# Patient Record
Sex: Male | Born: 1980 | Race: Black or African American | Hispanic: No | Marital: Single | Smoking: Never smoker
Health system: Southern US, Community
[De-identification: ages and names within clinical notes are randomized; demographics above are authoritative.]

## PROBLEM LIST (undated history)

## (undated) DIAGNOSIS — E119 Type 2 diabetes mellitus without complications: Secondary | ICD-10-CM

## (undated) DIAGNOSIS — I639 Cerebral infarction, unspecified: Secondary | ICD-10-CM

## (undated) HISTORY — PX: CATARACT EXTRACTION: SUR2

## (undated) HISTORY — PX: CARDIAC SURGERY: SHX584

---

## 2013-03-10 ENCOUNTER — Emergency Department (HOSPITAL_COMMUNITY)
Admission: EM | Admit: 2013-03-10 | Discharge: 2013-03-11 | Disposition: A | Discharge status: Home / Self Care | Payer: Self-pay | Attending: Emergency Medicine | Admitting: Emergency Medicine

## 2013-03-10 ENCOUNTER — Encounter (HOSPITAL_COMMUNITY): Payer: Self-pay | Admitting: Emergency Medicine

## 2013-03-10 DIAGNOSIS — R7309 Other abnormal glucose: Secondary | ICD-10-CM | POA: Insufficient documentation

## 2013-03-10 DIAGNOSIS — E119 Type 2 diabetes mellitus without complications: Secondary | ICD-10-CM | POA: Insufficient documentation

## 2013-03-10 DIAGNOSIS — Z9119 Patient's noncompliance with other medical treatment and regimen: Secondary | ICD-10-CM | POA: Insufficient documentation

## 2013-03-10 DIAGNOSIS — R739 Hyperglycemia, unspecified: Secondary | ICD-10-CM

## 2013-03-10 DIAGNOSIS — Z9114 Patient's other noncompliance with medication regimen: Secondary | ICD-10-CM

## 2013-03-10 DIAGNOSIS — R3589 Other polyuria: Secondary | ICD-10-CM | POA: Insufficient documentation

## 2013-03-10 DIAGNOSIS — Z794 Long term (current) use of insulin: Secondary | ICD-10-CM | POA: Insufficient documentation

## 2013-03-10 DIAGNOSIS — R631 Polydipsia: Secondary | ICD-10-CM | POA: Insufficient documentation

## 2013-03-10 DIAGNOSIS — R358 Other polyuria: Secondary | ICD-10-CM | POA: Insufficient documentation

## 2013-03-10 DIAGNOSIS — Z91199 Patient's noncompliance with other medical treatment and regimen due to unspecified reason: Secondary | ICD-10-CM | POA: Insufficient documentation

## 2013-03-10 DIAGNOSIS — Z79899 Other long term (current) drug therapy: Secondary | ICD-10-CM | POA: Insufficient documentation

## 2013-03-10 HISTORY — DX: Type 2 diabetes mellitus without complications: E11.9

## 2013-03-10 LAB — URINALYSIS, ROUTINE W REFLEX MICROSCOPIC
Glucose, UA: >1000 mg/dL — AB
Hgb urine dipstick: NEGATIVE
Ketones, ur: 15 mg/dL — AB
Leukocytes, UA: NEGATIVE
Protein, ur: NEGATIVE mg/dL
Urobilinogen, UA: 0.2 mg/dL (ref 0.0–1.0)

## 2013-03-10 LAB — POCT I-STAT, CHEM 8
Chloride: 91 mEq/L — ABNORMAL LOW (ref 96–112)
Glucose, Bld: >700 mg/dL (ref 70–99)
HCT: 55 % — ABNORMAL HIGH (ref 39.0–52.0)
Potassium: 4 mEq/L (ref 3.5–5.1)
Sodium: 125 mEq/L — ABNORMAL LOW (ref 135–145)

## 2013-03-10 LAB — URINE MICROSCOPIC-ADD ON

## 2013-03-10 LAB — CBC WITH DIFFERENTIAL/PLATELET
Basophils Relative: 1 % (ref 0–1)
HCT: 46.2 % (ref 39.0–52.0)
Hemoglobin: 16.4 g/dL (ref 13.0–17.0)
Lymphocytes Relative: 30 % (ref 12–46)
Lymphs Abs: 2.1 10*3/uL (ref 0.7–4.0)
MCHC: 35.5 g/dL (ref 30.0–36.0)
Monocytes Absolute: 0.8 10*3/uL (ref 0.1–1.0)
Monocytes Relative: 12 % (ref 3–12)
Neutro Abs: 4 10*3/uL (ref 1.7–7.7)
Neutrophils Relative %: 56 % (ref 43–77)
RBC: 5.8 MIL/uL (ref 4.22–5.81)
WBC: 7.1 10*3/uL (ref 4.0–10.5)

## 2013-03-10 LAB — COMPREHENSIVE METABOLIC PANEL
Albumin: 3.7 g/dL (ref 3.5–5.2)
Alkaline Phosphatase: 184 U/L — ABNORMAL HIGH (ref 39–117)
BUN: 9 mg/dL (ref 6–23)
CO2: 22 mEq/L (ref 19–32)
Chloride: 84 mEq/L — ABNORMAL LOW (ref 96–112)
Creatinine, Ser: 1.01 mg/dL (ref 0.50–1.35)
GFR calc non Af Amer: >90 mL/min (ref >90)
Potassium: 4 mEq/L (ref 3.5–5.1)
Total Bilirubin: 0.8 mg/dL (ref 0.3–1.2)

## 2013-03-10 LAB — GLUCOSE, CAPILLARY

## 2013-03-10 MED ORDER — SODIUM CHLORIDE 0.9 % IV SOLN: 1000.0000 mL | INTRAVENOUS | Status: DC

## 2013-03-10 MED ORDER — SODIUM CHLORIDE 0.9 % IV SOLN
1000.0000 mL | Freq: Once | INTRAVENOUS | Status: AC
  Administered 2013-03-11: 1000 mL via INTRAVENOUS

## 2013-03-10 NOTE — ED Notes (Signed)
PT. REPORTS ELEVATED BLOOD SUGAR THIS EVENING WITH POLYDIPSIA / POLYURIA , PT. DID NOT TAKE HIS INSULIN THIS EVENING . RESPIRATIONS UNLABORED.

## 2013-03-10 NOTE — ED Notes (Signed)
CBG Exceeds Measure. Notified Nurse Reita Cliche.

## 2013-03-11 LAB — GLUCOSE, CAPILLARY: Glucose-Capillary: 547 mg/dL — ABNORMAL HIGH (ref 70–99)

## 2013-03-11 MED ORDER — INSULIN ASPART 100 UNIT/ML ~~LOC~~ SOLN
8.0000 [IU] | Freq: Once | SUBCUTANEOUS | Status: AC
  Administered 2013-03-11: 8 [IU] via INTRAVENOUS
  Filled 2013-03-11: qty 1

## 2013-03-11 MED ORDER — INSULIN ASPART PROT & ASPART (70-30 MIX) 100 UNIT/ML ~~LOC~~ SUSP
30.0000 [IU] | Freq: Once | SUBCUTANEOUS | Status: AC
  Administered 2013-03-11: 30 [IU] via SUBCUTANEOUS
  Filled 2013-03-11: qty 10

## 2013-03-11 NOTE — ED Provider Notes (Signed)
CSN: [id number]     Arrival date & time 03/10/13  2240 History   First MD Initiated Contact with Patient 03/10/13 2347     Chief Complaint  Patient presents with  . Hyperglycemia    The history is provided by the patient.   patient reports hyperglycemia today.  The patient has a known history of diabetes.  He is on insulin 70/30, 30 units in the morning and in the evening.  He states his insulin has been in a car and his car has not been in this position of the past 36 hours.  He has not refilled his insulin at the pharmacy.  He denies nausea and vomiting.  He reports polyuria and polydipsia he reports his blood sugar was too high to check at home.  Patient without other complaints.  No dysuria.  No abdominal pain.  No headache.  No rash  Past Medical History  Diagnosis Date  . Diabetes mellitus without complication    Past Surgical History  Procedure Laterality Date  . Cardiac surgery     No family history on file. History  Substance Use Topics  . Smoking status: Never Smoker   . Smokeless tobacco: Not on file  . Alcohol Use: No    Review of Systems  All other systems reviewed and are negative.    Allergies  Pineapple  Home Medications   Current Outpatient Rx  Name  Route  Sig  Dispense  Refill  . insulin aspart protamine- aspart (NOVOLOG MIX 70/30) (70-30) 100 UNIT/ML injection   Subcutaneous   Inject 30 Units into the skin 2 (two) times daily with a meal.         . metFORMIN (GLUCOPHAGE) 500 MG tablet   Oral   Take 500 mg by mouth daily with breakfast.          BP 131/92  Pulse 92  Temp(Src) 98.1 F (36.7 C) (Oral)  Resp 18  SpO2 97% Physical Exam  Nursing note and vitals reviewed. Constitutional: He is oriented to person, place, and time. He appears well-developed and well-nourished.  HENT:  Head: Normocephalic and atraumatic.  Eyes: EOM are normal.  Neck: Normal range of motion.  Cardiovascular: Normal rate, regular rhythm, normal heart sounds and  intact distal pulses.   Pulmonary/Chest: Effort normal and breath sounds normal. No respiratory distress.  Abdominal: Soft. He exhibits no distension. There is no tenderness.  Genitourinary: Rectum normal.  Musculoskeletal: Normal range of motion.  Neurological: He is alert and oriented to person, place, and time.  Skin: Skin is warm and dry.  Psychiatric: He has a normal mood and affect. Judgment normal.    ED Course  Procedures (including critical care time) Labs Review Labs Reviewed  GLUCOSE, CAPILLARY - Abnormal; Notable for the following:    Glucose-Capillary >600 (*)    All other components within normal limits  COMPREHENSIVE METABOLIC PANEL - Abnormal; Notable for the following:    Sodium 123 (*)    Chloride 84 (*)    Glucose, Bld 876 (*)    Alkaline Phosphatase 184 (*)    All other components within normal limits  URINALYSIS, ROUTINE W REFLEX MICROSCOPIC - Abnormal; Notable for the following:    Specific Gravity, Urine 1.039 (*)    Glucose, UA >1000 (*)    Ketones, ur 15 (*)    All other components within normal limits  GLUCOSE, CAPILLARY - Abnormal; Notable for the following:    Glucose-Capillary 547 (*)    All other  components within normal limits  POCT I-STAT, CHEM 8 - Abnormal; Notable for the following:    Sodium 125 (*)    Chloride 91 (*)    Glucose, Bld >700 (*)    Hemoglobin 18.7 (*)    HCT 55.0 (*)    All other components within normal limits  CBC WITH DIFFERENTIAL  URINE MICROSCOPIC-ADD ON   Imaging Review No results found.  MDM   1. Hyperglycemia   2. Noncompliance with medication regimen    2:16 AM Patient feels much better after IV fluids and insulin.  His hyperglycemia secondary to noncompliance of his medications.  He has no signs of DKA.  His anion gap is normal.  Patient has had a significant decrease in his blood sugar in the emergency department from 876 tablet 547.  He was given his dose of nightly 70/30.  He states he will build refill  his prescription for 7030 tomorrow morning.    Lyanne Co, MD 03/11/13 [phone]

## 2013-03-11 NOTE — ED Notes (Signed)
CBG 547 

## 2013-03-14 ENCOUNTER — Emergency Department (HOSPITAL_COMMUNITY)
Admission: EM | Admit: 2013-03-14 | Discharge: 2013-03-14 | Disposition: A | Discharge status: Home / Self Care | Payer: Self-pay | Attending: Emergency Medicine | Admitting: Emergency Medicine

## 2013-03-14 ENCOUNTER — Encounter (HOSPITAL_COMMUNITY): Payer: Self-pay | Admitting: *Deleted

## 2013-03-14 DIAGNOSIS — Z794 Long term (current) use of insulin: Secondary | ICD-10-CM | POA: Insufficient documentation

## 2013-03-14 DIAGNOSIS — E119 Type 2 diabetes mellitus without complications: Secondary | ICD-10-CM | POA: Insufficient documentation

## 2013-03-14 DIAGNOSIS — R197 Diarrhea, unspecified: Secondary | ICD-10-CM | POA: Insufficient documentation

## 2013-03-14 DIAGNOSIS — R739 Hyperglycemia, unspecified: Secondary | ICD-10-CM

## 2013-03-14 LAB — CBC WITH DIFFERENTIAL/PLATELET
Basophils Absolute: 0.2 10*3/uL — ABNORMAL HIGH (ref 0.0–0.1)
HCT: 44.2 % (ref 39.0–52.0)
Lymphs Abs: 3.5 10*3/uL (ref 0.7–4.0)
MCH: 26.8 pg (ref 26.0–34.0)
MCHC: 33 g/dL (ref 30.0–36.0)
MCV: 81.1 fL (ref 78.0–100.0)
Monocytes Absolute: 1 10*3/uL (ref 0.1–1.0)
Neutro Abs: 4.6 10*3/uL (ref 1.7–7.7)
Platelets: 309 10*3/uL (ref 150–400)
RDW: 12.4 % (ref 11.5–15.5)

## 2013-03-14 LAB — BLOOD GAS, VENOUS
Acid-base deficit: 1.5 mmol/L (ref 0.0–2.0)
Bicarbonate: 20.2 mEq/L (ref 20.0–24.0)
O2 Saturation: 98.6 %
Patient temperature: 98.6
pO2, Ven: 116 mmHg — ABNORMAL HIGH (ref 30.0–45.0)

## 2013-03-14 LAB — GLUCOSE, CAPILLARY: Glucose-Capillary: 309 mg/dL — ABNORMAL HIGH (ref 70–99)

## 2013-03-14 LAB — BASIC METABOLIC PANEL
BUN: 8 mg/dL (ref 6–23)
Creatinine, Ser: 0.82 mg/dL (ref 0.50–1.35)
GFR calc Af Amer: >90 mL/min (ref >90)
GFR calc non Af Amer: >90 mL/min (ref >90)
Glucose, Bld: 461 mg/dL — ABNORMAL HIGH (ref 70–99)

## 2013-03-14 MED ORDER — INSULIN ASPART 100 UNIT/ML ~~LOC~~ SOLN: 2.0000 [IU] | Freq: Three times a day (TID) | SUBCUTANEOUS | Status: DC

## 2013-03-14 MED ORDER — INSULIN ASPART 100 UNIT/ML ~~LOC~~ SOLN
8.0000 [IU] | Freq: Once | SUBCUTANEOUS | Status: AC
  Administered 2013-03-14: 8 [IU] via SUBCUTANEOUS
  Filled 2013-03-14: qty 1

## 2013-03-14 MED ORDER — DIBUCAINE 1 % EX OINT: TOPICAL_OINTMENT | Freq: Three times a day (TID) | CUTANEOUS | Status: DC | PRN

## 2013-03-14 MED ORDER — LOPERAMIDE HCL 2 MG PO CAPS: 2.0000 mg | ORAL_CAPSULE | Freq: Four times a day (QID) | ORAL | Status: DC | PRN

## 2013-03-14 MED ORDER — SODIUM CHLORIDE 0.9 % IV BOLUS (SEPSIS)
1000.0000 mL | Freq: Once | INTRAVENOUS | Status: AC
  Administered 2013-03-14: 1000 mL via INTRAVENOUS

## 2013-03-14 NOTE — ED Provider Notes (Signed)
Medical screening examination/treatment/procedure(s) were conducted as a shared visit with non-physician practitioner(s) and myself.  I personally evaluated the patient during the encounter   Hyperglycemia has improved, stable for discharge. No signs of drainable abscess, and no fluctuance in rectal vault to suggest deep infection. Will recommend supportive care and f/u if worsens.  Audree Camel, MD 03/14/13 [phone]

## 2013-03-14 NOTE — ED Notes (Signed)
Pt took blood sugar yesterday around 2000 and blood sugar was around 600. Pt states he stayed home and drank about 2 gallons of h20. Pt states he retook cbg and was still elevated. Pt states he was seen [facility] and they sent him home with a blood sugar of 526.

## 2013-03-14 NOTE — ED Provider Notes (Signed)
7:12 AM BP 133/79  Pulse 87  Temp(Src) 98.3 F (36.8 C) (Oral)  Resp 18  Ht 5\' 11"  (1.803 m)  SpO2 95% Assumed care of the patient from NP [facility].  S-Patient is a newly diagnosed diabetic here increased her from [place] stating that he has had diarrhea and hyperglycemia.  Patient seen 03/10/2013 at [facility] emergency department for the same.  He had a non-anion gap hyperglycemia. Patient c/o rectal pain after defacation. Worse on R side  O- Obese male in NAD CV: RRR, No M/R/G, Peripheral pulses intact. No peripheral edema. Lungs: CTAB Abd: Soft, Non tender, non distended Digital Rectal Exam reveals sphincter with good tone. No external hemorrhoids. Induration at 9:00 with out palpable fluctuance or visible hemorrhoids, fissures. Stool color is brown with no overt blood.   A- Hyperglycemia without anion gap.   P- CBG<200 discharge. Add 2 untis Novolog TID before meals with cbg diary and pcp follow up this week.   9:18 AM Patient seen in shared visit with [facility] [place]. WIll d/c patient at this time with novolog, encourage warm soaks/ sits baths loperamide for diarrhea and dibucaine for rectal pain. Return if sxs of infection or abscess. Discussed verbally with  the patient.  Arthor Captain, PA-C 03/14/13 [phone]

## 2013-03-14 NOTE — ED Notes (Signed)
Pt given pitcher of water per [place], [place] to drink. Pt does not want to have another IV started d/t hard stick. Wants get fluids/meds PO

## 2013-03-14 NOTE — ED Provider Notes (Signed)
Medical screening examination/treatment/procedure(s) were performed by non-physician practitioner and as supervising physician I was immediately available for consultation/collaboration.  Olivia Mackie, MD 03/14/13 [phone]

## 2013-03-14 NOTE — ED Provider Notes (Signed)
CSN: [id number]     Arrival date & time 03/14/13  0226 History   First MD Initiated Contact with Patient 03/14/13 [phone]     Chief Complaint  Patient presents with  . Hyperglycemia   (Consider location/radiation/quality/duration/timing/severity/associated sxs/prior Treatment) HPI Comments: Patient states he's been newly diagnosed with diabetes within the last 2 months he is followed by his physician in [place] he was started on 7030 she states he's been taking he does report that the last 3 days he's had diarrhea while he is in [place] he was seen yesterday across his DD for blood sugar close to 600 states he was discharged with his blood sugar being made to high he states he did use a 7030 twice today but he's had diarrhea for the last 3 days. Which is not revealed in the patient's chart from yesterday  Patient is a 32 y.o. male presenting with hyperglycemia. The history is provided by the patient.  Hyperglycemia Blood sugar level PTA:  2000 Onset quality:  Unable to specify Duration:  3 days Timing:  Intermittent Progression:  Unchanged Diabetes status:  Controlled with insulin Current diabetic therapy:  70/30 Context: insulin pump use, new diabetes diagnosis and recent illness   Context: not change in medication   Relieved by:  None tried Ineffective treatments:  None tried Associated symptoms: no abdominal pain, no dizziness, no fever, no nausea, no shortness of breath and no vomiting   Associated symptoms comment:  Diarrhea   Past Medical History  Diagnosis Date  . Diabetes mellitus without complication    Past Surgical History  Procedure Laterality Date  . Cardiac surgery     History reviewed. No pertinent family history. History  Substance Use Topics  . Smoking status: Never Smoker   . Smokeless tobacco: Not on file  . Alcohol Use: No    Review of Systems  Constitutional: Negative for fever and chills.  Respiratory: Negative for shortness of breath.    Gastrointestinal: Positive for diarrhea. Negative for nausea, vomiting and abdominal pain.  Musculoskeletal: Negative for myalgias.  Skin: Negative for rash.  Neurological: Negative for dizziness and headaches.    Allergies  Pineapple  Home Medications   Current Outpatient Rx  Name  Route  Sig  Dispense  Refill  . insulin aspart protamine- aspart (NOVOLOG MIX 70/30) (70-30) 100 UNIT/ML injection   Subcutaneous   Inject 30 Units into the skin 2 (two) times daily with a meal.          BP 133/79  Pulse 87  Temp(Src) 98.3 F (36.8 C) (Oral)  Resp 18  Ht 5\' 11"  (1.803 m)  SpO2 95% Physical Exam  Nursing note and vitals reviewed. Constitutional: He is oriented to person, place, and time. He appears well-developed and well-nourished.  Obese  HENT:  Head: Normocephalic.  Eyes: Pupils are equal, round, and reactive to light.  Neck: Normal range of motion.  Cardiovascular: Normal rate and regular rhythm.   Pulmonary/Chest: Effort normal.  Abdominal: Soft.  Morbidly obese  Musculoskeletal: Normal range of motion. He exhibits no tenderness.  Lymphadenopathy:    He has no cervical adenopathy.  Neurological: He is alert and oriented to person, place, and time.  Skin: No pallor.    ED Course  Procedures (including critical care time) Labs Review Labs Reviewed  GLUCOSE, CAPILLARY - Abnormal; Notable for the following:    Glucose-Capillary 452 (*)    All other components within normal limits  CBC WITH DIFFERENTIAL  BASIC METABOLIC PANEL  BLOOD  GAS, VENOUS   Imaging Review No results found.  MDM  No diagnosis found. Patient is not appear ill or toxic his heart rate at 87 is not tachypneic with a respiratory rate of 18 I will add Humalog to his medical regime He is to test his BS prior to meals and keep a log  he is to use 2 units Humalog AC and continue 70/30 in AM and HS To follow up with PCP     Arman Filter, NP 03/14/13 [phone]

## 2013-03-16 ENCOUNTER — Encounter (HOSPITAL_COMMUNITY): Payer: Self-pay | Admitting: *Deleted

## 2013-03-16 ENCOUNTER — Emergency Department (HOSPITAL_COMMUNITY)
Admission: EM | Admit: 2013-03-16 | Discharge: 2013-03-16 | Disposition: A | Discharge status: Home / Self Care | Payer: Self-pay | Attending: Emergency Medicine | Admitting: Emergency Medicine

## 2013-03-16 DIAGNOSIS — K612 Anorectal abscess: Secondary | ICD-10-CM | POA: Insufficient documentation

## 2013-03-16 DIAGNOSIS — Z79899 Other long term (current) drug therapy: Secondary | ICD-10-CM | POA: Insufficient documentation

## 2013-03-16 DIAGNOSIS — E119 Type 2 diabetes mellitus without complications: Secondary | ICD-10-CM | POA: Insufficient documentation

## 2013-03-16 DIAGNOSIS — L0291 Cutaneous abscess, unspecified: Secondary | ICD-10-CM

## 2013-03-16 DIAGNOSIS — Z794 Long term (current) use of insulin: Secondary | ICD-10-CM | POA: Insufficient documentation

## 2013-03-16 LAB — GLUCOSE, CAPILLARY: Glucose-Capillary: 361 mg/dL — ABNORMAL HIGH (ref 70–99)

## 2013-03-16 MED ORDER — HYDROCODONE-ACETAMINOPHEN 5-325 MG PO TABS: 1.0000 | ORAL_TABLET | Freq: Four times a day (QID) | ORAL | Status: DC | PRN

## 2013-03-16 MED ORDER — SULFAMETHOXAZOLE-TRIMETHOPRIM 800-160 MG PO TABS: 2.0000 | ORAL_TABLET | Freq: Two times a day (BID) | ORAL | Status: DC

## 2013-03-16 NOTE — ED Provider Notes (Signed)
Medical screening examination/treatment/procedure(s) were conducted as a shared visit with non-physician practitioner(s) and myself.  I personally evaluated the patient during the encounter  Rectal pain with tender fluctuant palpable abscess on exam. Moderate amount of. Drainage with I&D  Diagnosis abscess  Plan discharge with antibiotics and referral to general surgery  Sunnie Nielsen, MD 03/16/13 0700

## 2013-03-16 NOTE — ED Notes (Signed)
Evaluated "the other day for an abscess on my bottom, he told me if it got bigger to come back" Reports abscess on rt buttocks

## 2013-03-16 NOTE — ED Provider Notes (Signed)
CSN: [id number]     Arrival date & time 03/16/13  0227 History   First MD Initiated Contact with Patient 03/16/13 0303     Chief Complaint  Patient presents with  . Abscess    buttocks   (Consider location/radiation/quality/duration/timing/severity/associated sxs/prior Treatment) HPI Comments: Patient presents with a rectal pain.  He reports that he has noticed pain and swelling of the right side of his rectum for the past couple of days, which is gradually worsening.  He was seen in the ED for the same yesterday.  At that time abscess was not thought to be drainable.  He reports that he does have pain with bowel movements.  He denies fever or chills.  Denies nausea or vomiting.  He denies abdominal pain.  He reports that he has never had an abscess before.  He is diabetic.    Patient is a 32 y.o. male presenting with abscess. The history is provided by the patient.  Abscess Associated symptoms: no fever     Past Medical History  Diagnosis Date  . Diabetes mellitus without complication    Past Surgical History  Procedure Laterality Date  . Cardiac surgery     No family history on file. History  Substance Use Topics  . Smoking status: Never Smoker   . Smokeless tobacco: Not on file  . Alcohol Use: No    Review of Systems  Constitutional: Negative for fever and chills.  All other systems reviewed and are negative.    Allergies  Pineapple  Home Medications   Current Outpatient Rx  Name  Route  Sig  Dispense  Refill  . dibucaine (NUPERCAINAL) 1 % ointment   Topical   Apply topically 3 (three) times daily as needed for pain.   30 g   0   . insulin aspart (NOVOLOG) 100 UNIT/ML injection   Subcutaneous   Inject 2 Units into the skin 3 (three) times daily before meals.   1 vial   12   . insulin aspart protamine- aspart (NOVOLOG MIX 70/30) (70-30) 100 UNIT/ML injection   Subcutaneous   Inject 30 Units into the skin 2 (two) times daily with a meal.         .  loperamide (IMODIUM) 2 MG capsule   Oral   Take 1 capsule (2 mg total) by mouth 4 (four) times daily as needed for diarrhea or loose stools.   30 capsule   0    BP 128/83  Pulse 101  Temp(Src) 99.1 F (37.3 C) (Oral)  Resp 16  SpO2 98% Physical Exam  Nursing note and vitals reviewed. Constitutional: He appears well-developed and well-nourished.  HENT:  Head: Normocephalic and atraumatic.  Mouth/Throat: Oropharynx is clear and moist.  Cardiovascular: Normal rate, regular rhythm and normal heart sounds.   Pulmonary/Chest: Effort normal and breath sounds normal.  Genitourinary: Testes normal. Right testis shows no mass, no swelling and no tenderness. Left testis shows no mass, no swelling and no tenderness. Uncircumcised.  Chaperone present.  Approximately 1.5 cm perirectal abscess present at the 9:00 position  Neurological: He is alert.  Skin: Skin is warm and dry.  Psychiatric: He has a normal mood and affect.    ED Course  Procedures (including critical care time) Labs Review Labs Reviewed - No data to display Imaging Review No results found.  INCISION AND DRAINAGE Performed by: Anne Shutter, Jonaven Hilgers Consent: Verbal consent obtained. Risks and benefits: risks, benefits and alternatives were discussed Type: abscess  Body area: perirectal  Anesthesia: local infiltration  Incision was made with a scalpel.  Local anesthetic: lidocaine 2% with epinephrine  Anesthetic total: 3 ml  Complexity: complex Blunt dissection to break up loculations  Drainage: purulent  Drainage amount: moderate  Patient tolerance: Patient tolerated the procedure well with no immediate complications.  Patient also evaluated by Dr. Dierdre Highman. MDM  No diagnosis found. Patient presenting with pain and swelling of the right perirectal area.  Area incised in the ED and moderate amount of discharge was drained.  Patient is afebrile.  Patient discharged home with antibiotics and given referral to  General Surgery.  Patient instructed to return in 48 hours to have the area rechecked.  Return precautions discussed with the patient.    Pascal Lux Pratt, PA-C 03/16/13 [phone]

## 2015-05-09 ENCOUNTER — Emergency Department (HOSPITAL_COMMUNITY): Payer: MEDICAID

## 2015-05-09 ENCOUNTER — Emergency Department (HOSPITAL_COMMUNITY)
Admission: EM | Admit: 2015-05-09 | Discharge: 2015-05-09 | Disposition: A | Discharge status: Home / Self Care | Payer: Self-pay | Attending: Emergency Medicine | Admitting: Emergency Medicine

## 2015-05-09 ENCOUNTER — Emergency Department (HOSPITAL_COMMUNITY): Payer: Self-pay

## 2015-05-09 ENCOUNTER — Encounter (HOSPITAL_COMMUNITY): Payer: Self-pay | Admitting: Emergency Medicine

## 2015-05-09 DIAGNOSIS — E860 Dehydration: Secondary | ICD-10-CM | POA: Insufficient documentation

## 2015-05-09 DIAGNOSIS — M79604 Pain in right leg: Secondary | ICD-10-CM | POA: Insufficient documentation

## 2015-05-09 DIAGNOSIS — E871 Hypo-osmolality and hyponatremia: Secondary | ICD-10-CM | POA: Insufficient documentation

## 2015-05-09 DIAGNOSIS — IMO0001 Reserved for inherently not codable concepts without codable children: Secondary | ICD-10-CM

## 2015-05-09 DIAGNOSIS — E119 Type 2 diabetes mellitus without complications: Secondary | ICD-10-CM

## 2015-05-09 DIAGNOSIS — Z79899 Other long term (current) drug therapy: Secondary | ICD-10-CM | POA: Insufficient documentation

## 2015-05-09 DIAGNOSIS — E1165 Type 2 diabetes mellitus with hyperglycemia: Secondary | ICD-10-CM | POA: Insufficient documentation

## 2015-05-09 DIAGNOSIS — M79671 Pain in right foot: Secondary | ICD-10-CM

## 2015-05-09 DIAGNOSIS — R739 Hyperglycemia, unspecified: Secondary | ICD-10-CM

## 2015-05-09 DIAGNOSIS — M79672 Pain in left foot: Secondary | ICD-10-CM

## 2015-05-09 DIAGNOSIS — Z794 Long term (current) use of insulin: Secondary | ICD-10-CM | POA: Insufficient documentation

## 2015-05-09 DIAGNOSIS — R52 Pain, unspecified: Secondary | ICD-10-CM

## 2015-05-09 LAB — CBG MONITORING, ED
GLUCOSE-CAPILLARY: 491 mg/dL — AB (ref 65–99)
Glucose-Capillary: 332 mg/dL — ABNORMAL HIGH (ref 65–99)
Glucose-Capillary: 361 mg/dL — ABNORMAL HIGH (ref 65–99)

## 2015-05-09 LAB — BASIC METABOLIC PANEL
ANION GAP: 10 (ref 5–15)
BUN: 13 mg/dL (ref 6–20)
CHLORIDE: 95 mmol/L — AB (ref 101–111)
CO2: 24 mmol/L (ref 22–32)
Calcium: 9.5 mg/dL (ref 8.9–10.3)
Creatinine, Ser: 1.13 mg/dL (ref 0.61–1.24)
GFR calc Af Amer: >60 mL/min (ref >60)
GLUCOSE: 489 mg/dL — AB (ref 65–99)
POTASSIUM: 4.5 mmol/L (ref 3.5–5.1)
Sodium: 129 mmol/L — ABNORMAL LOW (ref 135–145)

## 2015-05-09 LAB — CBC
HEMATOCRIT: 48.3 % (ref 39.0–52.0)
HEMOGLOBIN: 16.5 g/dL (ref 13.0–17.0)
MCH: 27.4 pg (ref 26.0–34.0)
MCHC: 34.2 g/dL (ref 30.0–36.0)
MCV: 80.1 fL (ref 78.0–100.0)
Platelets: 291 10*3/uL (ref 150–400)
RBC: 6.03 MIL/uL — ABNORMAL HIGH (ref 4.22–5.81)
RDW: 12.3 % (ref 11.5–15.5)
WBC: 6.7 10*3/uL (ref 4.0–10.5)

## 2015-05-09 LAB — RAPID URINE DRUG SCREEN, HOSP PERFORMED
Amphetamines: NOT DETECTED
BARBITURATES: NOT DETECTED
BENZODIAZEPINES: NOT DETECTED
COCAINE: NOT DETECTED
Opiates: NOT DETECTED
TETRAHYDROCANNABINOL: NOT DETECTED

## 2015-05-09 LAB — URINALYSIS, ROUTINE W REFLEX MICROSCOPIC
BILIRUBIN URINE: NEGATIVE
Glucose, UA: >1000 mg/dL — AB
HGB URINE DIPSTICK: NEGATIVE
Ketones, ur: NEGATIVE mg/dL
Leukocytes, UA: NEGATIVE
Nitrite: NEGATIVE
PH: 6 (ref 5.0–8.0)
Protein, ur: NEGATIVE mg/dL
SPECIFIC GRAVITY, URINE: 1.028 (ref 1.005–1.030)
Urobilinogen, UA: 0.2 mg/dL (ref 0.0–1.0)

## 2015-05-09 LAB — URINE MICROSCOPIC-ADD ON

## 2015-05-09 MED ORDER — KETOROLAC TROMETHAMINE 30 MG/ML IJ SOLN
INTRAMUSCULAR | Status: AC
  Filled 2015-05-09: qty 1

## 2015-05-09 MED ORDER — OXYCODONE-ACETAMINOPHEN 5-325 MG PO TABS
1.0000 | ORAL_TABLET | Freq: Once | ORAL | Status: AC
  Administered 2015-05-09: 1 via ORAL
  Filled 2015-05-09: qty 1

## 2015-05-09 MED ORDER — KETOROLAC TROMETHAMINE 30 MG/ML IJ SOLN
30.0000 mg | Freq: Once | INTRAMUSCULAR | Status: AC
  Administered 2015-05-09: 30 mg via INTRAVENOUS

## 2015-05-09 MED ORDER — SODIUM CHLORIDE 0.9 % IV BOLUS (SEPSIS)
1000.0000 mL | Freq: Once | INTRAVENOUS | Status: AC
  Administered 2015-05-09: 1000 mL via INTRAVENOUS

## 2015-05-09 MED ORDER — GABAPENTIN 300 MG PO CAPS
300.0000 mg | ORAL_CAPSULE | Freq: Two times a day (BID) | ORAL | Status: DC
Start: 2015-05-09 — End: 2015-05-09
  Administered 2015-05-09: 300 mg via ORAL
  Filled 2015-05-09: qty 1

## 2015-05-09 MED ORDER — INSULIN ASPART 100 UNIT/ML ~~LOC~~ SOLN
15.0000 [IU] | Freq: Once | SUBCUTANEOUS | Status: AC
  Administered 2015-05-09: 15 [IU] via SUBCUTANEOUS
  Filled 2015-05-09: qty 1

## 2015-05-09 MED ORDER — GABAPENTIN 600 MG PO TABS
300.0000 mg | ORAL_TABLET | Freq: Two times a day (BID) | ORAL | Status: DC
  Filled 2015-05-09 (×2): qty 0.5

## 2015-05-09 MED ORDER — OXYCODONE-ACETAMINOPHEN 5-325 MG PO TABS: 1.0000 | ORAL_TABLET | ORAL | Status: DC | PRN

## 2015-05-09 NOTE — ED Notes (Signed)
Case management at bedside.

## 2015-05-09 NOTE — Care Management Note (Addendum)
Case Management Note  Patient Details  Name: Bruce Vega MRN: [id number] Date of Birth: Apr 16, 1981  Subjective/Objective:            34 yr old uninsured male states he was in [place] for 11 Months and has recently been transferred back to this area Pt reports his new [place].  Pt reports being seen by a "transitional doctor in [place]"  Pt confirms he does not yet have a local pcp but agrees to allow CM to assist in finding resources in [employer] he has applied for [facility] and it is pending Reports he is scheduled to see a person at [facility] on "wednesday" to follow up.  Pt voices understanding about not being in DKA but states he is mainly concern with his foot. Cm inquired about compliance with DM treatment, medications, nutrition, etc Pt informs CM he is compliant Reports he has been taking Lantus and Humalog and paying for it out of pocket.     Action/Plan: CM consulted by [facility] who voices concern. Pt not in DKA, further ED treatment.  Cm spoke with EDP who states pt needs to be admitted for "hyponatremia, dehydration and hyperglycemia", CM spoke with pt ED updated ED RN.  Cm updated Hospitalist.    CM discussed information for uninsured accepting pcps, discussed the importance of pcp vs EDP services for f/u care, www.needymeds.org, www.goodrx.com, discounted pharmacies (insulin brand names at [facility]) and DSS Pt voiced understanding and appreciation of resources provided    Expected Discharge Date:   05/09/2015               Expected Discharge Plan:  Home/Self Care  In-House Referral:  PCP / Health Connect  Discharge planning Services  CM Consult, Medication Assistance, Indigent Health Clinic  Post Acute Care Choice:  NA Choice offered to:  Patient  DME Arranged:    DME Agency:     HH Arranged:    HH Agency:     Status of Service:  Completed, signed off Additional Comments: ED CM spoke  with Dr Effie Shy about Dr Elisabeth Pigeon concerns. Cm spoke with pt see notes above 1310 Cm updated Dr Elisabeth Pigeon on pt right foot being his concern Pt to be evaluated  1347 Discussed pt with Dr Elisabeth Pigeon To obtain imaging-xray pending results Discussed CM is offering pt community resources for [place] 1355 CM spoke with Maralyn Sago of [facility] to attempt to get pt an appt Maralyn Sago states since pt is uninsured he would need to call to provide financial information Reports earliest appt is the end of November- first of December 2016 at this time Information placed in pt discharge instructions.  Pt encouraged to check on status of medicaid and possible medicaid assigned doctor Pt given a list of [place] uninsured programs/clinics to included [place] Development worker, international aid, community care center, [employer] [facility], community mosque clinic, health care access, med-aid, [place] [facility], Mining engineer center, crisis control ministry, Immunologist, [facility], [facility], [place] [facility] [employer] [facility] 1412 Cm sent [facility] Cm a message to see if pt meets criteria for f/u appt  1555 Cm received a return text from [place] stating pt not meeting criteria for TCC but she could offer an appt for f/u care.  Cm received a call back from Dr Elisabeth Pigeon after reviewed imaging and states pt is not a candidate for admission.  CM updated pt and EDP, [place] Pt offered the [facility] f/u appt but he refused [place] States also his "daughter lives there"   Dr Elisabeth Pigeonevine states recommendations are listed in EPIC for EDP -EDP made aware Cm updated [facility] Cm of pt [place]    Ophelia ShoulderGibbs, [name], RN 05/09/2015, 1:05 PM

## 2015-05-09 NOTE — Progress Notes (Signed)
   05/09/15 0000  CM Assessment  Expected Discharge Plan Home/Self Care  In-house Referral PCP / Health Connect  Discharge Planning Services CM Consult;Medication Assistance;Indigent Health Clinic  [facility] NA  Choice offered to / list presented to  Patient  Status of Service Completed, signed off  Discharge Disposition Home/Self Care

## 2015-05-09 NOTE — ED Notes (Signed)
Patient is aware that we need urine sample. Patient will call out when ready.

## 2015-05-09 NOTE — Progress Notes (Addendum)
  Pt presented with reports of bilateral feel pain. His work up in ED revealed CBG in 400's Pt given insulin 15 units and now his CBG in 330-360 range  He is not in DKA. No nausea or vomiting. Tolerates regular diet. He can resume insulin regime with recommendation to increase it to 5 units TID instead of 2 units TID. He needs to follow up with the physician in [place] who will continue to check his CBG's and A1c to establish the glycemic control.  In regards to his feet pain, x ray of his feet did not reveal acute findings.   Also, note hyponatremia is likely pseudohyponatremia in the setting of hyperglycemia. Needs outpt follow up.  Manson Passeylma Yann Biehn [facility] [phone]

## 2015-05-09 NOTE — Progress Notes (Signed)
ED CM called by Hospitalist about concern with pt admission

## 2015-05-09 NOTE — ED Provider Notes (Signed)
CSN: [id number]     Arrival date & time 05/09/15  0732 History   First MD Initiated Contact with Patient 05/09/15 [phone]     Chief Complaint  Patient presents with  . Hyperglycemia     (Consider location/radiation/quality/duration/timing/severity/associated sxs/prior Treatment) HPI   Bruce Vega is a 34 y.o. male who presents for evaluation of high blood sugar and toe pain. States the toes of his right foot up and hurting for several days. He was hospitalized one month ago for DKA, in [place], [place]. He does not have a local doctor at this time. He is continuing to take his metformin and sliding scale insulin. He is concerned that the veins of his feet are more prominent than usual. He denies weakness, dizziness, fever, chills, back pain, chest pain or abdominal pain. There are no other known modifying factors.   Past Medical History  Diagnosis Date  . Diabetes mellitus without complication [facility])    Past Surgical History  Procedure Laterality Date  . Cardiac surgery     No family history on file. Social History  Substance Use Topics  . Smoking status: Never Smoker   . Smokeless tobacco: None  . Alcohol Use: No    Review of Systems  All other systems reviewed and are negative.     Allergies  Pineapple  Home Medications   Prior to Admission medications   Medication Sig Start Date End Date Taking? Authorizing Provider  insulin aspart (NOVOLOG) 100 UNIT/ML injection Inject 2 Units into the skin 3 (three) times daily before meals. Patient taking differently: Inject 1-5 Units into the skin 3 (three) times daily before meals. Sliding scale 03/14/13  Yes Earley Favor, NP  insulin detemir (LEVEMIR) 100 UNIT/ML injection Inject 35 Units into the skin 2 (two) times daily. 05/11/14 05/11/15 Yes Historical Provider, MD  rivaroxaban (XARELTO) 20 MG TABS tablet Take 20 mg by mouth daily. 05/11/14  Yes Historical Provider, MD   BP 147/106 mmHg  Pulse 72  Temp(Src)  98.8 F (37.1 C) (Oral)  Resp 16  SpO2 99% Physical Exam  Constitutional: He is oriented to person, place, and time. He appears well-developed and well-nourished.  HENT:  Head: Normocephalic and atraumatic.  Right Ear: External ear normal.  Left Ear: External ear normal.  Eyes: Conjunctivae and EOM are normal. Pupils are equal, round, and reactive to light.  Neck: Normal range of motion and phonation normal. Neck supple.  Cardiovascular: Normal rate, regular rhythm and normal heart sounds.   Pulmonary/Chest: Effort normal and breath sounds normal. He exhibits no bony tenderness.  Abdominal: Soft. There is no tenderness.  Musculoskeletal: Normal range of motion.  No deformity of feet. No swelling of toes. Normal toenails of the feet bilaterally.  Neurological: He is alert and oriented to person, place, and time. No cranial nerve deficit or sensory deficit. He exhibits normal muscle tone. Coordination normal.  Skin: Skin is warm, dry and intact.  Psychiatric: He has a normal mood and affect. His behavior is normal. Judgment and thought content normal.  Nursing note and vitals reviewed.   ED Course  Procedures (including critical care time)  Medications  oxyCODONE-acetaminophen (PERCOCET/ROXICET) 5-325 MG per tablet 1 tablet (not administered)  sodium chloride 0.9 % bolus 1,000 mL (0 mLs Intravenous Stopped 05/09/15 0949)  sodium chloride 0.9 % bolus 1,000 mL (0 mLs Intravenous Stopped 05/09/15 0949)  ketorolac (TORADOL) 30 MG/ML injection 30 mg (30 mg Intravenous Given 05/09/15 0818)    Patient Vitals for the past 24  hrs:  BP Temp Temp src Pulse Resp SpO2  05/09/15 1138 (!) 147/106 mmHg 98.8 F (37.1 C) Oral 72 16 99 %  05/09/15 0740 131/90 mmHg 97.7 F (36.5 C) Oral 97 18 100 %    12:00 PM Reevaluation with update and discussion. After initial assessment and treatment, an updated evaluation reveals he now states that his entire right leg hurts, and that the pain is unbearable.  He No relief from Toradol, which was administered. He states that he is using his Levemir twice a day, 35 units, and taking his insulin as per his sliding scale. He states that his sliding scale was not adjusted when he was hospitalized, one month ago. He does not currently have a PCP. Bruce Vega   12:05 PM-Consult complete with hospitalist, Dr. Elisabeth Pigeon. Patient case explained and discussed. She does not believe that the patient needs to be admitted. She will see the patient patient for further evaluation and care of recommendations for care. Call ended at 12:15   13:25-awaiting hospitalist consultation, subcutaneous insulin ordered, as sliding scale coverage. Patient now states that he did not take his Levemir this morning, because he was coming to the emergency department.   4:11 PM Reevaluation with update and discussion. After initial assessment and treatment, an updated evaluation reveals she is fairly comfortable at this time. I discussed with him the evaluation of the hospitalist, Dr. Elisabeth Pigeon. She is unable to admit him with his constellation of problems at this time. Patient states that he understands this and is comfortable going home, now. Bruce Vega    Labs Review Labs Reviewed  BASIC METABOLIC PANEL - Abnormal; Notable for the following:    Sodium 129 (*)    Chloride 95 (*)    Glucose, Bld 489 (*)    All other components within normal limits  CBC - Abnormal; Notable for the following:    RBC 6.03 (*)    All other components within normal limits  URINALYSIS, ROUTINE W REFLEX MICROSCOPIC (NOT AT [facility])  CBG MONITORING, ED    Imaging Review No results found. I have personally reviewed and evaluated these images and lab results as part of my medical decision-making.   EKG Interpretation None      MDM   Final diagnoses:  Hyperglycemia  Insulin dependent diabetes mellitus (HCC)  Right leg pain  Hyponatremia  Dehydration    Nonspecific right leg pain, and  hyperglycemia despite taking usual medications. No evidence for DKA, metabolic instability or impending vascular collapse. Unfortunately, the patient does not currently have a primary care provider, therefore, he is at risk for worsening, if he is discharged without access to follow-up care. Per charts from epic, the patient had previous noncompliance with scheduled appointment follow-up, which additionally puts him at risk, should this behavior continue.  Nursing Notes Reviewed/ Care Coordinated, and agree without changes. Applicable Imaging Reviewed.  Interpretation of Laboratory Data incorporated into ED treatment    Plan:- Medicine evaluation for admission from the emergency department.   Medical evaluation in the emergency department by the internist, the patient was ready to go home. He has access to follow-up care, with documentation, and to him by nursing, case management. Therefore, he is now stable for discharge.    The patient appears reasonably screened and/or stabilized for discharge and I doubt any other medical condition or other [facility] requiring further screening, evaluation, or treatment in the ED at this time prior to discharge.  Plan: Home Medications- Percocet; Home Treatments- rest; return here  if the recommended treatment, does not improve the symptoms; Recommended follow up- PCP asap   Mancel BaleElliott Remy Voiles, MD 05/09/15 [phone]

## 2015-05-09 NOTE — ED Notes (Signed)
CBG 491 

## 2015-05-09 NOTE — Discharge Instructions (Signed)
You can try increasing her sliding scale insulin by 2-3 units every 50 mg/dl CBG increment. This will help to decrease your blood sugar.  Try to drink 1-2 L of water each day.  Follow-up with a primary care doctor as soon as possible for further care and treatment of your diabetes, and foot pain.        health care provider has decided you need to take insulin regularly. You have been given a correction scale (also called a sliding scale) in case you need extra insulin when your blood sugar is too high (hyperglycemia). The following instructions will assist you in how to use that correction scale.  WHAT IS A CORRECTION SCALE?  When you check your blood sugar, sometimes it will be higher than your health care provider has told you it should be. You may need an extra dose of insulin to bring your blood sugar to the recommended level (also known as your goal, target, or normal level). The correction scale is prescribed by your health care provider based on your specific needs.  Your correction scale has two parts:   The first shows you a blood sugar range.   The second part tells you how much extra insulin to give yourself if your blood sugar falls within this range. You will not need an extra dose of insulin if your blood glucose is in the desired range. You should simply give yourself the normal amount of insulin that your health care provider has ordered for you.  WHY IS IT IMPORTANT TO KEEP YOUR BLOOD SUGAR LEVELS AT YOUR DESIRED LEVEL?  Keeping your blood sugar at the desired level helps to prevent long-term complications of diabetes, such as eye disease, kidney failure, nerve damage, and other serious complications. WHAT TYPE OF INSULIN WILL YOU USE?  To help bring down blood sugar levels that are too high, your health care provider will prescribe a short-acting or a rapid-acting insulin. An example of a short-acting insulin would be regular insulin. Remember, you may also have a  longer-acting insulin prescribed for you.  WHAT DO YOU NEED TO DO?   Check your blood sugar with your home blood glucose meter as recommended by your health care provider.   Using your correction scale, find the range that your blood sugar lies in.   Look for the units of insulin that match that blood sugar range. Give yourself the dose of correction insulin your health care provider has prescribed. Always make sure you are using the right type of insulin.   Prior to the injection, make sure you have food available that you can eat in the next 15-30 minutes.   If your correction insulin is rapid acting, start eating your meal within 15 minutes after you have given yourself the insulin injection. If you wait longer than 15 minutes to eat, your blood sugar might get too low.   If your correction insulin is short acting(regular), start eating your meal within 30 minutes after you have given yourself the insulin injection. If you wait longer than 30 minutes to eat, your blood sugar might get too low. Symptoms of low blood sugar (hypoglycemia) may include feeling shaky or weak, sweating, feeling confused, difficulty seeing, agitation, crankiness, or numbness of the lips or tongue. Check your blood sugar immediately and treat your results as directed by your health care provider.   Keep a log of your blood sugar results with the time you took the test and the amount of insulin that  you injected. This information will help your health care provider manage your medicines.   Note on your log anything that may affect your blood sugar level, such as:   Changes in normal exercise or activity.   Changes in your normal schedule, such as staying up late, going on vacation, changing your diet, or holidays.   New medicines. This includes prescription and over-the-counter medicines. Some medicines may cause high blood sugar.   Sickness, stress, or anxiety.   Changes in the time you took your  medicine.   Changes in your meals, such as skipping a meal, having a late meal, or dining out.   Eating things that may affect blood glucose, such as snacks, meal portions that are larger than normal, drinks with sugar, or eating less than usual.   Ask your health care provider any questions you have.  Be aware of "stacking" your insulin doses. This happens when you correct a high blood sugar level by giving yourself extra insulin too soon after a previous correction dose or mealtime dose. You may then have too much insulin still active in your body and may be at risk for hypoglycemia. WHY DO YOU NEED A CORRECTION SCALE IF YOU HAVE NEVER BEEN DIAGNOSED WITH DIABETES?   Keeping your blood glucose in the target range is important for your overall health.   You may have been prescribed medicines that cause your blood glucose to be higher than normal. WHEN SHOULD YOU SEEK MEDICAL CARE? Contact your health care provider if:   You have experienced hypoglycemia that you are unable to treat with your usual routine.   You have a high blood sugar level that is not coming down with the correction dose.  Your blood sugar is often too low or does not come up even if you eat a fast-acting carbohydrate. Someone who lives with you should seek immediate medical care if you become unresponsive.   This information is not intended to replace advice given to you by your health care provider. Make sure you discuss any questions you have with your health care provider.   Document Released: 11/23/2010 Document Revised: 03/04/2013 Document Reviewed: 12/12/2012 Elsevier Interactive Patient Education 2016 Else[employer] Inc.  Dehydration, Adult Dehydration means your body does not have as much fluid or water as it needs. It happens when you take in less fluid than you lose. Your kidneys, brain, and heart will not work properly without the right amount of fluids.  Dehydration can range from mild to severe. It  should be treated right away to help prevent it from becoming severe. HOME CARE  Drink enough fluid to keep your pee (urine) clear or pale yellow.  Drink water or fluid slowly by taking small sips. You can also try sucking on ice cubes.  Have food or drinks that contain electrolytes. Examples include bananas and sports drinks.  Take over-the-counter and prescription medicines only as told by your doctor.  Prepare oral rehydration solution (ORS) according to the instructions that came with it. Take sips of ORS every 5 minutes until your pee returns to normal.  If you are throwing up (vomiting) or have watery poop (diarrhea), keep trying to drink water, ORS, or both.  If you have watery poop, avoid:  Drinks with caffeine.  Fruit juice.  Milk.  Carbonated soft drinks.  Do not take salt tablets. This can lead to having too much sodium in your body (hypernatremia). GET HELP IF:  You cannot eat or drink without throwing up.  You have had mild watery poop for longer than 24 hours.  You have a fever. GET HELP RIGHT AWAY IF:   You have very strong thirst.  You have very bad watery poop.  You have not peed in 6-8 hours, or you have peed only a small amount of very dark pee.  You have shriveled skin.  You are dizzy, confused, or both.   This information is not intended to replace advice given to you by your health care provider. Make sure you discuss any questions you have with your health care provider.   Document Released: 04/28/2009 Document Revised: 03/23/2015 Document Reviewed: 11/17/2014 Elsevier Interactive Patient Education 2016 Elsevier Inc.  [employer] Therapy Heat therapy can help ease sore, stiff, injured, and tight muscles and joints. Heat relaxes your muscles, which may help ease your pain. Heat therapy should only be used on old, pre-existing, or long-lasting (chronic) injuries. Do not use heat therapy unless told by your doctor. HOW TO USE HEAT THERAPY There are  several different kinds of heat therapy, including:  Moist heat pack.  Warm water bath.  Hot water bottle.  Electric heating pad.  Heated gel pack.  Heated wrap.  Electric heating pad. GENERAL HEAT THERAPY RECOMMENDATIONS   Do not sleep while using heat therapy. Only use heat therapy while you are awake.  Your skin may turn pink while using heat therapy. Do not use heat therapy if your skin turns red.  Do not use heat therapy if you have new pain.  High heat or long exposure to heat can cause burns. Be careful when using heat therapy to avoid burning your skin.  Do not use heat therapy on areas of your skin that are already irritated, such as with a rash or sunburn. GET HELP IF:   You have blisters, redness, swelling (puffiness), or numbness.  You have new pain.  Your pain is worse. MAKE SURE YOU:  Understand these instructions.  Will watch your condition.  Will get help right away if you are not doing well or get worse.   This information is not intended to replace advice given to you by your health care provider. Make sure you discuss any questions you have with your health care provider.   Document Released: 09/24/2011 Document Revised: 07/23/2014 Document Reviewed: 08/25/2013 Elsevier Interactive Patient Education 2016 Elsevier Inc.  Hyperglycemia High blood sugar (hyperglycemia) means that the level of sugar in your blood is higher than it should be. Signs of high blood sugar include:  Feeling thirsty.  Frequent peeing (urinating).  Feeling tired or sleepy.  Dry mouth.  Vision changes.  Feeling weak.  Feeling hungry but losing weight.  Numbness and tingling in your hands or feet.  Headache. When you ignore these signs, your blood sugar may keep going up. These problems may get worse, and other problems may begin. HOME CARE  Check your blood sugars as told by your doctor. Write down the numbers with the date and time.  Take the right amount  of insulin or diabetes pills at the right time. Write down the dose with date and time.  Refill your insulin or diabetes pills before running out.  Watch what you eat. Follow your meal plan.  Drink liquids without sugar, such as water. Check with your doctor if you have kidney or heart disease.  Follow your doctor's orders for exercise. Exercise at the same time of day.  Keep your doctor's appointments. GET HELP RIGHT AWAY IF:   You have trouble  thinking or are confused.  You have fast breathing with fruity smelling breath.  You pass out (faint).  You have 2 to 3 days of high blood sugars and you do not know why.  You have chest pain.  You are feeling sick to your stomach (nauseous) or throwing up (vomiting).  You have sudden vision changes. MAKE SURE YOU:   Understand these instructions.  Will watch your condition.  Will get help right away if you are not doing well or get worse.   This information is not intended to replace advice given to you by your health care provider. Make sure you discuss any questions you have with your health care provider.   Document Released: 04/29/2009 Document Revised: 07/23/2014 Document Reviewed: 03/08/2015 Elsevier Interactive Patient Education [employer]

## 2015-05-09 NOTE — ED Notes (Signed)
Per pt, states sugar has been high on and off since yesterday-states all his toes on right foot hurt, no injury

## 2015-05-09 NOTE — ED Notes (Signed)
Pt requesting pain medications.  Made Dr Effie ShyWentz aware.  No new orders at this time. MD will see patient.

## 2015-05-09 NOTE — ED Notes (Signed)
Pt CBG 322.

## 2015-06-14 ENCOUNTER — Encounter (HOSPITAL_COMMUNITY): Payer: Self-pay | Admitting: Emergency Medicine

## 2015-06-14 ENCOUNTER — Emergency Department (HOSPITAL_COMMUNITY)
Admission: EM | Admit: 2015-06-14 | Discharge: 2015-06-15 | Disposition: A | Discharge status: Home / Self Care | Payer: Self-pay | Attending: Emergency Medicine | Admitting: Emergency Medicine

## 2015-06-14 DIAGNOSIS — M79604 Pain in right leg: Secondary | ICD-10-CM | POA: Insufficient documentation

## 2015-06-14 DIAGNOSIS — E1165 Type 2 diabetes mellitus with hyperglycemia: Secondary | ICD-10-CM | POA: Insufficient documentation

## 2015-06-14 DIAGNOSIS — Z794 Long term (current) use of insulin: Secondary | ICD-10-CM | POA: Insufficient documentation

## 2015-06-14 DIAGNOSIS — R739 Hyperglycemia, unspecified: Secondary | ICD-10-CM

## 2015-06-14 DIAGNOSIS — Z79899 Other long term (current) drug therapy: Secondary | ICD-10-CM | POA: Insufficient documentation

## 2015-06-14 DIAGNOSIS — M79605 Pain in left leg: Secondary | ICD-10-CM | POA: Insufficient documentation

## 2015-06-14 DIAGNOSIS — M79606 Pain in leg, unspecified: Secondary | ICD-10-CM

## 2015-06-14 LAB — URINE MICROSCOPIC-ADD ON
BACTERIA UA: NONE SEEN
RBC / HPF: NONE SEEN RBC/hpf (ref 0–5)
WBC, UA: NONE SEEN WBC/hpf (ref 0–5)

## 2015-06-14 LAB — CBC
HEMATOCRIT: 50.2 % (ref 39.0–52.0)
Hemoglobin: 17 g/dL (ref 13.0–17.0)
MCH: 27.5 pg (ref 26.0–34.0)
MCHC: 33.9 g/dL (ref 30.0–36.0)
MCV: 81.1 fL (ref 78.0–100.0)
PLATELETS: 295 10*3/uL (ref 150–400)
RBC: 6.19 MIL/uL — ABNORMAL HIGH (ref 4.22–5.81)
RDW: 12 % (ref 11.5–15.5)
WBC: 6 10*3/uL (ref 4.0–10.5)

## 2015-06-14 LAB — BASIC METABOLIC PANEL
ANION GAP: 10 (ref 5–15)
BUN: 13 mg/dL (ref 6–20)
CHLORIDE: 96 mmol/L — AB (ref 101–111)
CO2: 26 mmol/L (ref 22–32)
Calcium: 9.8 mg/dL (ref 8.9–10.3)
Creatinine, Ser: 1.17 mg/dL (ref 0.61–1.24)
GFR calc non Af Amer: >60 mL/min (ref >60)
Glucose, Bld: 460 mg/dL — ABNORMAL HIGH (ref 65–99)
POTASSIUM: 5.1 mmol/L (ref 3.5–5.1)
SODIUM: 132 mmol/L — AB (ref 135–145)

## 2015-06-14 LAB — URINALYSIS, ROUTINE W REFLEX MICROSCOPIC
Bilirubin Urine: NEGATIVE
HGB URINE DIPSTICK: NEGATIVE
KETONES UR: 15 mg/dL — AB
LEUKOCYTES UA: NEGATIVE
Nitrite: NEGATIVE
PH: 5.5 (ref 5.0–8.0)
Protein, ur: NEGATIVE mg/dL
Specific Gravity, Urine: 1.037 — ABNORMAL HIGH (ref 1.005–1.030)

## 2015-06-14 LAB — CBG MONITORING, ED
Glucose-Capillary: 361 mg/dL — ABNORMAL HIGH (ref 65–99)
Glucose-Capillary: 494 mg/dL — ABNORMAL HIGH (ref 65–99)

## 2015-06-14 LAB — CK: CK TOTAL: 75 U/L (ref 49–397)

## 2015-06-14 MED ORDER — SODIUM CHLORIDE 0.9 % IV BOLUS (SEPSIS)
1000.0000 mL | Freq: Once | INTRAVENOUS | Status: AC
  Administered 2015-06-14: 1000 mL via INTRAVENOUS

## 2015-06-14 MED ORDER — KETOROLAC TROMETHAMINE 30 MG/ML IJ SOLN
INTRAMUSCULAR | Status: AC
  Filled 2015-06-14: qty 1

## 2015-06-14 MED ORDER — KETOROLAC TROMETHAMINE 30 MG/ML IJ SOLN
30.0000 mg | Freq: Once | INTRAMUSCULAR | Status: AC
  Administered 2015-06-14: 30 mg via INTRAVENOUS
  Filled 2015-06-14: qty 1

## 2015-06-14 MED ORDER — INSULIN ASPART 100 UNIT/ML ~~LOC~~ SOLN
20.0000 [IU] | Freq: Once | SUBCUTANEOUS | Status: AC
  Administered 2015-06-14: 20 [IU] via SUBCUTANEOUS
  Filled 2015-06-14: qty 1

## 2015-06-14 NOTE — ED Provider Notes (Signed)
CSN: [id number]     Arrival date & time 06/14/15  1414 History   First MD Initiated Contact with Patient 06/14/15 2134     Chief Complaint  Patient presents with  . Hyperglycemia  . Leg Pain     (Consider location/radiation/quality/duration/timing/severity/associated sxs/prior Treatment) HPI Comments: The patient is a 34 year old male, he has a history of diabetes diagnosed several years ago, he has been on insulin therapy, he did not take his insulin today. He reports 2 days of lower extremity cramping, he does not have weakness numbness or tingling but has pain in his bilateral legs in the thighs and the calves. This is worse  when he sits and lays, better when he stands.  He has no shortness of breath chest pain fevers chills coughing shortness of breath nausea vomiting or diarrhea, no dysuria though he does have urinary frequency and noted his blood sugar to be elevated today. The patient does have a family history of lupus though he does never have any symptoms like this in the past.  Patient is a 34 y.o. male presenting with hyperglycemia and leg pain. The history is provided by the patient.  Hyperglycemia Leg Pain   Past Medical History  Diagnosis Date  . Diabetes mellitus without complication [facility](HCC)    Past Surgical History  Procedure Laterality Date  . Cardiac surgery     History reviewed. No pertinent family history. Social History  Substance Use Topics  . Smoking status: Never Smoker   . Smokeless tobacco: None  . Alcohol Use: No    Review of Systems  All other systems reviewed and are negative.     Allergies  Pineapple  Home Medications   Prior to Admission medications   Medication Sig Start Date End Date Taking? Authorizing Provider  insulin aspart (NOVOLOG) 100 UNIT/ML injection Inject 2 Units into the skin 3 (three) times daily before meals. Patient taking differently: Inject 1-5 Units into the skin 3 (three) times daily before meals. Sliding scale  03/14/13  Yes Earley FavorGail Schulz, NP  insulin detemir (LEVEMIR) 100 UNIT/ML injection Inject 35 Units into the skin 2 (two) times daily. 05/11/14 06/14/15 Yes Historical Provider, MD  rivaroxaban (XARELTO) 20 MG TABS tablet Take 20 mg by mouth daily. 05/11/14  Yes Historical Provider, MD  HYDROcodone-acetaminophen (NORCO/VICODIN) 5-325 MG tablet Take 2 tablets by mouth every 4 (four) hours as needed. 06/15/15   Eber HongBrian Raneshia Derick, MD  oxyCODONE-acetaminophen (PERCOCET) 5-325 MG tablet Take 1 tablet by mouth every 4 (four) hours as needed for severe pain. Patient not taking: Reported on 06/14/2015 05/09/15   Mancel BaleElliott [place], MD   BP 142/92 mmHg  Pulse 81  Temp(Src) 97.5 F (36.4 C) (Oral)  Resp 18  SpO2 100% Physical Exam  Constitutional: He appears well-developed and well-nourished. No distress.  HENT:  Head: Normocephalic and atraumatic.  Mouth/Throat: Oropharynx is clear and moist. No oropharyngeal exudate.  Eyes: Conjunctivae and EOM are normal. Pupils are equal, round, and reactive to light. Right eye exhibits no discharge. Left eye exhibits no discharge. No scleral icterus.  Neck: Normal range of motion. Neck supple. No JVD present. No thyromegaly present.  Cardiovascular: Normal rate, regular rhythm, normal heart sounds and intact distal pulses.  Exam reveals no gallop and no friction rub.   No murmur heard. Pulmonary/Chest: Effort normal and breath sounds normal. No respiratory distress. He has no wheezes. He has no rales.  Abdominal: Soft. Bowel sounds are normal. He exhibits no distension and no mass. There is no tenderness.  Musculoskeletal: Normal range of motion. He exhibits tenderness ( Minimally tender to palpation in the bilateral thighs and calves, otherwise joints are supple, compartments are soft). He exhibits no edema.  Lymphadenopathy:    He has no cervical adenopathy.  Neurological: He is alert. Coordination normal.  Skin: Skin is warm and dry. No rash noted. No erythema.   Psychiatric: He has a normal mood and affect. His behavior is normal.  Nursing note and vitals reviewed.   ED Course  Procedures (including critical care time) Labs Review Labs Reviewed  BASIC METABOLIC PANEL - Abnormal; Notable for the following:    Sodium 132 (*)    Chloride 96 (*)    Glucose, Bld 460 (*)    All other components within normal limits  CBC - Abnormal; Notable for the following:    RBC 6.19 (*)    All other components within normal limits  URINALYSIS, ROUTINE W REFLEX MICROSCOPIC (NOT AT [facility]) - Abnormal; Notable for the following:    Specific Gravity, Urine 1.037 (*)    Glucose, UA >1000 (*)    Ketones, ur 15 (*)    All other components within normal limits  URINE MICROSCOPIC-ADD ON - Abnormal; Notable for the following:    Squamous Epithelial / LPF 0-5 (*)    All other components within normal limits  CBG MONITORING, ED - Abnormal; Notable for the following:    Glucose-Capillary 494 (*)    All other components within normal limits  CBG MONITORING, ED - Abnormal; Notable for the following:    Glucose-Capillary 361 (*)    All other components within normal limits  CK  CBG MONITORING, ED    Imaging Review No results found. I have personally reviewed and evaluated these images and lab results as part of my medical decision-making.   MDM   Final diagnoses:  Hyperglycemia  Pain of lower extremity, unspecified laterality    The patient has ongoing symptoms likely related to hyperglycemia, question whether there is a history of autoimmune disease in the family, evidently his mother died at a young age and had a history of lupus at some point. The patient's exam is unremarkable, he is minimally tender in his thighs, check a CK, labs otherwise are unremarkable except for a sugar, no diabetic ketoacidosis, fluids, pain medication, anticipate discharge, the patient is otherwise well-appearing.  Creatine kinase is normal, labs otherwise unremarkable, no DKA, the  patient has minimal ketones, no gap, normal CO2, does not appear toxic, has received IV fluids and has improved significantly, good drop in his blood sugar to 360 after half of the fluids, the patient will be discharged home in improved condition, given pain medications prior to discharge, the etiology of the patient's pain is not clear but appears to be nonacute, he will follow-up with a family doctor, he was given a follow-up list. He expresses understanding.  Meds given in ED:  Medications  ketorolac (TORADOL) 30 MG/ML injection (not administered)  HYDROcodone-acetaminophen (NORCO/VICODIN) 5-325 MG per tablet 2 tablet (not administered)  sodium chloride 0.9 % bolus 1,000 mL (1,000 mLs Intravenous New Bag/Given 06/14/15 2252)  sodium chloride 0.9 % bolus 1,000 mL (1,000 mLs Intravenous New Bag/Given 06/14/15 2251)  insulin aspart (novoLOG) injection 20 Units (20 Units Subcutaneous Given 06/14/15 2235)  ketorolac (TORADOL) 30 MG/ML injection 30 mg (30 mg Intravenous Given 06/14/15 2252)      Eber Hong, MD 06/15/15 [phone]

## 2015-06-14 NOTE — ED Notes (Signed)
Nurse starting IV getting CK

## 2015-06-14 NOTE — ED Notes (Signed)
Pt has hx of DM2. Pt reports bilateral leg pain for the past 2 days. Denies any injury, pain over entire leg. Pt also reports his CBG has been elevated since last night. Has been taking medications as directed, no recent changes in medications. Pt reports he has lost 100lb over the past 2 years since diagnosis of DM.

## 2015-06-15 MED ORDER — HYDROCODONE-ACETAMINOPHEN 5-325 MG PO TABS: 2.0000 | ORAL_TABLET | ORAL | Status: DC | PRN

## 2015-06-15 MED ORDER — HYDROCODONE-ACETAMINOPHEN 5-325 MG PO TABS
2.0000 | ORAL_TABLET | Freq: Once | ORAL | Status: AC
  Administered 2015-06-15: 2 via ORAL
  Filled 2015-06-15: qty 2

## 2015-06-15 NOTE — Discharge Instructions (Signed)
Please obtain all of your results from medical records or have your doctors office obtain the results - share them with your doctor - you should be seen at your doctors office in the next 2 days. Call today to arrange your follow up. Take the medications as prescribed. Please review all of the medicines and only take them if you do not have an allergy to them. Please be aware that if you are taking birth control pills, taking other prescriptions, ESPECIALLY ANTIBIOTICS may make the birth control ineffective - if this is the case, either do not engage in sexual activity or use alternative methods of birth control such as condoms until you have finished the medicine and your family doctor says it is OK to restart them. If you are on a blood thinner such as COUMADIN, be aware that any other medicine that you take may cause the coumadin to either work too much, or not enough - you should have your coumadin level rechecked in next 7 days if this is the case.  °?  °It is also a possibility that you have an allergic reaction to any of the medicines that you have been prescribed - Everybody reacts differently to medications and while MOST people have no trouble with most medicines, you may have a reaction such as nausea, vomiting, rash, swelling, shortness of breath. If this is the case, please stop taking the medicine immediately and contact your physician.  °?  °You should return to the ER if you develop severe or worsening symptoms.  ° ° °Emergency Department Resource Guide °1) Find a Doctor and Pay Out of Pocket °Although you won't have to find out who is covered by your insurance plan, it is a good idea to ask around and get recommendations. You will then need to call the office and see if the doctor you have chosen will accept you as a new patient and what types of options they offer for patients who are self-pay. Some doctors offer discounts or will set up payment plans for their patients who do not have insurance,  but you will need to ask so you aren't surprised when you get to your appointment. ° °2) Contact Your Local Health Department °Not all health departments have doctors that can see patients for sick visits, but many do, so it is worth a call to see if yours does. If you don't know where your local health department is, you can check in your phone book. The CDC also has a tool to help you locate your state's health department, and many state websites also have listings of all of their local health departments. ° °3) Find a Walk-in Clinic °If your illness is not likely to be very severe or complicated, you may want to try a walk in clinic. These are popping up all over the country in pharmacies, drugstores, and shopping centers. They're usually staffed by nurse practitioners or physician assistants that have been trained to treat common illnesses and complaints. They're usually fairly quick and inexpensive. However, if you have serious medical issues or chronic medical problems, these are probably not your best option. ° °No Primary Care Doctor: °- Call [facility] at  832-8000 - they can help you locate a primary care doctor that  accepts your insurance, provides certain services, etc. °- Physician Referral Service- 1-800-533-3463 ° °Chronic Pain Problems: °Organization         Address  Phone   Notes  °[facility]  (336)   297-2271 Patients need to be referred by their primary care doctor.  ° °Medication Assistance: °Organization         Address  Phone   Notes  °[place] [facility] [address] °[place], [place] [address] (336) 641-8030 --Must be a resident of [place] °-- Must have NO insurance coverage whatsoever (no Medicaid/ Medicare, etc.) °-- The pt. MUST have a primary care doctor that directs their care regularly and follows them in the community °  °[facility]  (866) 331-1348   °[employer]  (888) 892-1162   ° °Agencies that provide inexpensive  medical care: °Organization         Address  Phone   Notes  °[facility]  (336) 832-8035   °[facility]    (336) 832-7272   °[facility] [address] °[place], [place] [address] (336) 832-4777   °[facility] [address], °[place] (336) 271-4999   °[facility]    (336) 373-0678   °[facility]    (336) 272-1050   °[facility] ° [address], [place] Phone:  (336) 832-4444, Fax:  (336) 832-4440 Hours of Operation:  9 am - 6 pm, M-F.  Also accepts Medicaid/Medicare and self-pay.  °[facility] ° [address], [place] Phone: (336) 832-3150, Fax: (336) 832-3151. Hours of Operation:  8:30 am - 5:30 pm, M-F.  Also accepts Medicaid and self-pay.  °[facility] [place] [address], [place] Phone: (336) 878-6027   °[facility] [address], [place], [place] (336)723-1848, Ext. 123 Mondays & Thursdays: 7-9 AM.  First 15 patients are seen on a first come, first serve basis. °  ° °Medicaid-accepting [place] Providers: ° °Organization         Address  Phone   Notes  °[facility] [address], [place] (336) 641-2100 Also accepts self-pay patients.  °[facility] [address], [place] ° (336) 856-9996   °[facility] [address], [place] (336) 288-8857   °[facility] [address] [place] [address], [place] (336) 299-7000   °Veita Bland [address], [place]  ° (336) 373-1557 Only accepts [place] Access Medicaid patients after they have their name applied to their card.  ° °Self-Pay (no insurance) in [place]: ° °Organization         Address  Phone   Notes  °Sickle Cell Patients, [facility] [address], [place] (336) 832-1970   °[facility] [address], [place] (336) 832-4400   °[facility] ° [address], [place] (336) 992-4800   °[facility]/Dr. Osei-Bonsu ° [address] [place] [address], [place] or [address], [place] (336) 841-8500 Phone number for both [place] and [place] locations is the same.  °[facility] [address], [place] (336) 299-0000   °[facility] [address] [place] [address], [place] or [address] (336) 852-7530 °(336) 878-2260   °[facility] [address],  (336) 350-1642, phone; (336) 294-5005, fax Sees patients 1st and 3rd Saturday of every month.  Must not qualify for public or private insurance (i.e. Medicaid, Medicare,  Health Choice, Veterans' Benefits) •   Household income should be no more than 200% of the poverty level •The clinic cannot treat you if you are pregnant or think you are pregnant • Sexually transmitted diseases are not treated at the clinic.  ° ° °Dental Care: °Organization         Address  Phone  Notes  °[facility] [address], [place] (336) 641-6152 Accepts children up to age 21 who are enrolled in Medicaid or [place] Health Choice; pregnant women with a Medicaid card; and children who have applied for Medicaid or [place] Health Choice, but were declined, whose parents can pay a reduced fee at time of service.  °[facility] [place]  [address], [place] (336) 641-7733 Accepts children up to age 21 who are enrolled in Medicaid or [place] Health Choice; pregnant women with a Medicaid card; and children who have applied for Medicaid or [place] Health Choice, but were declined, whose parents can pay a reduced fee at time of service.  °[facility] ° [address], [place] (336) 641-4533 Patients are seen by appointment only. Walk-ins are not accepted.  [facility] will see patients 18 years of age and older. °Monday - Tuesday (8am-5pm) °Most Wednesdays (8:30-5pm) °$30 per visit, cash only  °[facility] ° [address], [place] (336) 641-4533 Patients are seen by appointment only. Walk-ins are not accepted. [facility] will see patients 18 years of age and older. °One Wednesday Evening (Monthly: Volunteer Based).  $30 per visit, cash only  °[facility]  (919) 537-3737 for adults; Children under age 4, call Graduate Pediatric Dentistry at (919) [phone]. Children aged 4-14, please call (919) [phone] to request a pediatric application. ° Dental services are provided in all areas of dental care including fillings, crowns and bridges, complete and partial dentures, implants, gum treatment, root canals, and extractions. Preventive care is also provided. Treatment is provided to both adults and children. °Patients are selected via a lottery and there is often a waiting list. °  °[facility] [address], °[place] ° (336) 763-8833 [email or web address] °  °[facility] [address], [place], [place] (336)723-1848, Ext. 123 Second and Fourth Thursday of each month, opens at 6:30 AM; Clinic ends at 9 AM.  Patients are seen on a first-come first-served basis, and a limited number are seen during each clinic.  ° °[facility] ° [address], [place], [place] (336) 723-7904   Eligibility Requirements °You must have lived in [place], [place], or [place] counties for at least the last three months. °  You cannot be eligible for state or federal sponsored healthcare insurance, including Veterans Administration, Medicaid, or Medicare. °  You generally cannot be eligible for healthcare insurance through your employer.  °  How to apply: °Eligibility screenings are held every Tuesday and Wednesday afternoon from 1:00 pm until 4:00 pm. You do not need an appointment for the  interview!  °[facility] [address], [place], [place] 336-631-2330   °[facility]  336-342-8273   °[facility]  336-703-3100   °[facility]  336-570-6415   ° °Behavioral Health Resources in the Community: °Intensive Outpatient Programs °Organization         Address  Phone  Notes  °[facility] [address], [place],   [place] 336-878-6098   °[facility] [address], [place], [place] 336-832-9800   °[facility] [address], [place], [place] ° 336-882-2125   °[facility] [address],  °[place], [place] 1-800-853-5163 or 336-641-4981   °Substance Abuse Resources °Organization         Address  Phone  Notes  °[facility]  336-882-2125   °[facility]  336-784-9470   °[facility]  336-285-9073   °[facility]  336-845-3988   °Residential & Outpatient Substance Abuse Program  1-800-659-3381   °Psychological Services °Organization         Address  Phone  Notes  °[facility]  336- 832-9600   °[facility]  336- 378-7881   °[facility] [address], [place] 1-800-853-5163 or 336-641-4981   ° °Mobile Crisis Teams °Organization         Address  Phone  Notes  °[facility], Mobile Crisis Care Unit  1-877-626-1772   °Assertive °[facility] ° [address] [place], [place] 336-834-9664   °Sharon DeEsch [address] °[place] 336-554-5454   ° °Self-Help/Support Groups °Organization         Address  Phone             Notes  °[facility] - variety of support groups  336- 373-1402 Call for more information  °Narcotics Anonymous (NA), [facility] [address], °[place]  2 meetings at this location  ° °Residential Treatment Programs °Organization         Address  Phone  Notes  °[facility]  [address],    °[place]  1-866-801-8205   °[facility] ° [address], [place], [place] 704-293-8524   °[facility] [address], [place] 336-845-3988 Admissions: 8am-3pm M-F  °[facility] [address]    °[place], [place] 336-841-1104   °[facility] [address], [place], [place] 336-379-7146   °[facility] [address]  °[place], [place] 336-285-9073   °[facility] - Intensive Outpatient [address], [place], [place] 336-852-3033   °[facility] [address]  °[place], [place] 1-877-615-2722 or 336-784-9470   °[facility] ([facility] [address] , [place] 336-227-7417 Accepts Medicaid  °[facility] [address]  °[place] 1-800-659-3381 Substance Abuse/Addiction Treatment  ° °[place] Behavioral Health Resources °Organization         Address  Phone  Notes  °[facility]  (888) 581-9988   °Julie Brannon, PhD [address] [place], [place]   (336) 349-5553 or (336) 951-0000   °[facility]   [address] °[place], [place] (336) 349-4454   °[facility] [address], [place], [place] (336) 342-8316 Insurance/Medicaid/sponsorship through [facility]  °[facility] [address]                                    [place], [place] (336) 342-8316 Therapy/tele-psych/case  °[facility] [address]  ° [place],  (336) 349-2233    °Dr. Arfeen  (336) 349-4544   °[facility] [place]  [employer] [place] [facility] 1) [address], [place] °2) [address], [place] °3)  [address], [place] (336) 349-3220 °(336) 342-7768 ° °(336) 342-8140   °[place] Child Abuse [facility] (336) 342-1394 or (336) 342-3537 (After Hours)    ° ° ° °

## 2021-04-03 ENCOUNTER — Other Ambulatory Visit: Payer: Self-pay

## 2021-04-03 ENCOUNTER — Encounter (HOSPITAL_COMMUNITY): Payer: Self-pay | Admitting: Emergency Medicine

## 2021-04-03 ENCOUNTER — Emergency Department (HOSPITAL_COMMUNITY): Payer: Medicaid Other

## 2021-04-03 ENCOUNTER — Inpatient Hospital Stay (HOSPITAL_COMMUNITY)
Admission: EM | Admit: 2021-04-03 | Discharge: 2021-04-05 | DRG: 065 | Disposition: A | Discharge status: Home / Self Care | Payer: Medicaid Other | Attending: Family Medicine | Admitting: Family Medicine

## 2021-04-03 DIAGNOSIS — N179 Acute kidney failure, unspecified: Secondary | ICD-10-CM | POA: Diagnosis present

## 2021-04-03 DIAGNOSIS — Z6832 Body mass index (BMI) 32.0-32.9, adult: Secondary | ICD-10-CM

## 2021-04-03 DIAGNOSIS — E1010 Type 1 diabetes mellitus with ketoacidosis without coma: Secondary | ICD-10-CM

## 2021-04-03 DIAGNOSIS — Z91018 Allergy to other foods: Secondary | ICD-10-CM

## 2021-04-03 DIAGNOSIS — Z79899 Other long term (current) drug therapy: Secondary | ICD-10-CM

## 2021-04-03 DIAGNOSIS — E871 Hypo-osmolality and hyponatremia: Secondary | ICD-10-CM | POA: Diagnosis present

## 2021-04-03 DIAGNOSIS — Z951 Presence of aortocoronary bypass graft: Secondary | ICD-10-CM | POA: Diagnosis not present

## 2021-04-03 DIAGNOSIS — E1122 Type 2 diabetes mellitus with diabetic chronic kidney disease: Secondary | ICD-10-CM | POA: Diagnosis present

## 2021-04-03 DIAGNOSIS — R279 Unspecified lack of coordination: Secondary | ICD-10-CM | POA: Diagnosis not present

## 2021-04-03 DIAGNOSIS — G8321 Monoplegia of upper limb affecting right dominant side: Secondary | ICD-10-CM | POA: Diagnosis present

## 2021-04-03 DIAGNOSIS — E1142 Type 2 diabetes mellitus with diabetic polyneuropathy: Secondary | ICD-10-CM | POA: Diagnosis present

## 2021-04-03 DIAGNOSIS — I6502 Occlusion and stenosis of left vertebral artery: Secondary | ICD-10-CM | POA: Diagnosis not present

## 2021-04-03 DIAGNOSIS — R209 Unspecified disturbances of skin sensation: Secondary | ICD-10-CM | POA: Diagnosis present

## 2021-04-03 DIAGNOSIS — I2510 Atherosclerotic heart disease of native coronary artery without angina pectoris: Secondary | ICD-10-CM | POA: Diagnosis present

## 2021-04-03 DIAGNOSIS — Z86718 Personal history of other venous thrombosis and embolism: Secondary | ICD-10-CM

## 2021-04-03 DIAGNOSIS — Z7982 Long term (current) use of aspirin: Secondary | ICD-10-CM | POA: Diagnosis not present

## 2021-04-03 DIAGNOSIS — R29700 NIHSS score 0: Secondary | ICD-10-CM | POA: Diagnosis not present

## 2021-04-03 DIAGNOSIS — I69398 Other sequelae of cerebral infarction: Secondary | ICD-10-CM | POA: Diagnosis not present

## 2021-04-03 DIAGNOSIS — R29898 Other symptoms and signs involving the musculoskeletal system: Secondary | ICD-10-CM | POA: Diagnosis not present

## 2021-04-03 DIAGNOSIS — E669 Obesity, unspecified: Secondary | ICD-10-CM | POA: Diagnosis present

## 2021-04-03 DIAGNOSIS — I129 Hypertensive chronic kidney disease with stage 1 through stage 4 chronic kidney disease, or unspecified chronic kidney disease: Secondary | ICD-10-CM | POA: Diagnosis present

## 2021-04-03 DIAGNOSIS — Z9114 Patient's other noncompliance with medication regimen: Secondary | ICD-10-CM

## 2021-04-03 DIAGNOSIS — E878 Other disorders of electrolyte and fluid balance, not elsewhere classified: Secondary | ICD-10-CM

## 2021-04-03 DIAGNOSIS — Z20822 Contact with and (suspected) exposure to covid-19: Secondary | ICD-10-CM | POA: Diagnosis present

## 2021-04-03 DIAGNOSIS — Z7901 Long term (current) use of anticoagulants: Secondary | ICD-10-CM | POA: Diagnosis not present

## 2021-04-03 DIAGNOSIS — R29702 NIHSS score 2: Secondary | ICD-10-CM | POA: Diagnosis present

## 2021-04-03 DIAGNOSIS — G459 Transient cerebral ischemic attack, unspecified: Secondary | ICD-10-CM | POA: Diagnosis present

## 2021-04-03 DIAGNOSIS — Z8673 Personal history of transient ischemic attack (TIA), and cerebral infarction without residual deficits: Secondary | ICD-10-CM | POA: Diagnosis not present

## 2021-04-03 DIAGNOSIS — N182 Chronic kidney disease, stage 2 (mild): Secondary | ICD-10-CM | POA: Diagnosis present

## 2021-04-03 DIAGNOSIS — E1165 Type 2 diabetes mellitus with hyperglycemia: Secondary | ICD-10-CM | POA: Diagnosis present

## 2021-04-03 DIAGNOSIS — Z833 Family history of diabetes mellitus: Secondary | ICD-10-CM | POA: Diagnosis not present

## 2021-04-03 DIAGNOSIS — Z794 Long term (current) use of insulin: Secondary | ICD-10-CM

## 2021-04-03 DIAGNOSIS — E875 Hyperkalemia: Secondary | ICD-10-CM | POA: Diagnosis present

## 2021-04-03 DIAGNOSIS — G8929 Other chronic pain: Secondary | ICD-10-CM | POA: Diagnosis present

## 2021-04-03 DIAGNOSIS — E782 Mixed hyperlipidemia: Secondary | ICD-10-CM | POA: Diagnosis not present

## 2021-04-03 DIAGNOSIS — I63312 Cerebral infarction due to thrombosis of left middle cerebral artery: Secondary | ICD-10-CM | POA: Diagnosis not present

## 2021-04-03 DIAGNOSIS — Z5329 Procedure and treatment not carried out because of patient's decision for other reasons: Secondary | ICD-10-CM | POA: Diagnosis present

## 2021-04-03 DIAGNOSIS — I639 Cerebral infarction, unspecified: Secondary | ICD-10-CM | POA: Diagnosis present

## 2021-04-03 DIAGNOSIS — E785 Hyperlipidemia, unspecified: Secondary | ICD-10-CM | POA: Diagnosis present

## 2021-04-03 DIAGNOSIS — Z832 Family history of diseases of the blood and blood-forming organs and certain disorders involving the immune mechanism: Secondary | ICD-10-CM

## 2021-04-03 HISTORY — DX: Cerebral infarction, unspecified: I63.9

## 2021-04-03 LAB — COMPREHENSIVE METABOLIC PANEL
ALT: 20 U/L (ref 0–44)
AST: 24 U/L (ref 15–41)
Albumin: 3.5 g/dL (ref 3.5–5.0)
Alkaline Phosphatase: 115 U/L (ref 38–126)
Anion gap: 13 (ref 5–15)
BUN: 16 mg/dL (ref 6–20)
CO2: 21 mmol/L — ABNORMAL LOW (ref 22–32)
Calcium: 9.1 mg/dL (ref 8.9–10.3)
Chloride: 94 mmol/L — ABNORMAL LOW (ref 98–111)
Creatinine, Ser: 1.54 mg/dL — ABNORMAL HIGH (ref 0.61–1.24)
GFR, Estimated: 59 mL/min — ABNORMAL LOW (ref >60)
Glucose, Bld: 562 mg/dL (ref 70–99)
Potassium: 5.3 mmol/L — ABNORMAL HIGH (ref 3.5–5.1)
Sodium: 128 mmol/L — ABNORMAL LOW (ref 135–145)
Total Bilirubin: 1.1 mg/dL (ref 0.3–1.2)
Total Protein: 7.2 g/dL (ref 6.5–8.1)

## 2021-04-03 LAB — I-STAT VENOUS BLOOD GAS, ED
Acid-base deficit: 2 mmol/L (ref 0.0–2.0)
Bicarbonate: 20.1 mmol/L (ref 20.0–28.0)
Calcium, Ion: 0.93 mmol/L — ABNORMAL LOW (ref 1.15–1.40)
HCT: 49 % (ref 39.0–52.0)
Hemoglobin: 16.7 g/dL (ref 13.0–17.0)
O2 Saturation: 98 %
Potassium: 5.3 mmol/L — ABNORMAL HIGH (ref 3.5–5.1)
Sodium: 127 mmol/L — ABNORMAL LOW (ref 135–145)
TCO2: 21 mmol/L — ABNORMAL LOW (ref 22–32)
pCO2, Ven: 27.5 mmHg — ABNORMAL LOW (ref 44.0–60.0)
pH, Ven: 7.471 — ABNORMAL HIGH (ref 7.250–7.430)
pO2, Ven: 99 mmHg — ABNORMAL HIGH (ref 32.0–45.0)

## 2021-04-03 LAB — I-STAT CHEM 8, ED
BUN: 22 mg/dL — ABNORMAL HIGH (ref 6–20)
Calcium, Ion: 0.91 mmol/L — ABNORMAL LOW (ref 1.15–1.40)
Chloride: 104 mmol/L (ref 98–111)
Creatinine, Ser: 1.4 mg/dL — ABNORMAL HIGH (ref 0.61–1.24)
Glucose, Bld: 588 mg/dL (ref 70–99)
HCT: 50 % (ref 39.0–52.0)
Hemoglobin: 17 g/dL (ref 13.0–17.0)
Potassium: 5.7 mmol/L — ABNORMAL HIGH (ref 3.5–5.1)
Sodium: 127 mmol/L — ABNORMAL LOW (ref 135–145)
TCO2: 18 mmol/L — ABNORMAL LOW (ref 22–32)

## 2021-04-03 LAB — RAPID URINE DRUG SCREEN, HOSP PERFORMED
Amphetamines: NOT DETECTED
Barbiturates: NOT DETECTED
Benzodiazepines: NOT DETECTED
Cocaine: NOT DETECTED
Opiates: NOT DETECTED
Tetrahydrocannabinol: NOT DETECTED

## 2021-04-03 LAB — BASIC METABOLIC PANEL
Anion gap: 13 (ref 5–15)
BUN: 14 mg/dL (ref 6–20)
CO2: 19 mmol/L — ABNORMAL LOW (ref 22–32)
Calcium: 8.8 mg/dL — ABNORMAL LOW (ref 8.9–10.3)
Chloride: 98 mmol/L (ref 98–111)
Creatinine, Ser: 1.19 mg/dL (ref 0.61–1.24)
GFR, Estimated: >60 mL/min (ref >60)
Glucose, Bld: 427 mg/dL — ABNORMAL HIGH (ref 70–99)
Potassium: 4.5 mmol/L (ref 3.5–5.1)
Sodium: 130 mmol/L — ABNORMAL LOW (ref 135–145)

## 2021-04-03 LAB — CBC WITH DIFFERENTIAL/PLATELET
Abs Immature Granulocytes: 0.02 10*3/uL (ref 0.00–0.07)
Basophils Absolute: 0.1 10*3/uL (ref 0.0–0.1)
Basophils Relative: 2 %
Eosinophils Absolute: 0.1 10*3/uL (ref 0.0–0.5)
Eosinophils Relative: 3 %
HCT: 47.9 % (ref 39.0–52.0)
Hemoglobin: 14.7 g/dL (ref 13.0–17.0)
Immature Granulocytes: 0 %
Lymphocytes Relative: 32 %
Lymphs Abs: 1.6 10*3/uL (ref 0.7–4.0)
MCH: 25.6 pg — ABNORMAL LOW (ref 26.0–34.0)
MCHC: 30.7 g/dL (ref 30.0–36.0)
MCV: 83.3 fL (ref 80.0–100.0)
Monocytes Absolute: 0.5 10*3/uL (ref 0.1–1.0)
Monocytes Relative: 9 %
Neutro Abs: 2.7 10*3/uL (ref 1.7–7.7)
Neutrophils Relative %: 54 %
Platelets: 328 10*3/uL (ref 150–400)
RBC: 5.75 MIL/uL (ref 4.22–5.81)
RDW: 13 % (ref 11.5–15.5)
WBC: 5 10*3/uL (ref 4.0–10.5)
nRBC: 0 % (ref 0.0–0.2)

## 2021-04-03 LAB — URINALYSIS, MICROSCOPIC (REFLEX)
Bacteria, UA: NONE SEEN
Squamous Epithelial / HPF: NONE SEEN (ref 0–5)

## 2021-04-03 LAB — URINALYSIS, ROUTINE W REFLEX MICROSCOPIC
Bilirubin Urine: NEGATIVE
Glucose, UA: >=500 mg/dL — AB
Ketones, ur: NEGATIVE mg/dL
Leukocytes,Ua: NEGATIVE
Nitrite: NEGATIVE
Protein, ur: 30 mg/dL — AB
Specific Gravity, Urine: <1.005 — ABNORMAL LOW (ref 1.005–1.030)
pH: 6 (ref 5.0–8.0)

## 2021-04-03 LAB — CBG MONITORING, ED
Glucose-Capillary: 497 mg/dL — ABNORMAL HIGH (ref 70–99)
Glucose-Capillary: 409 mg/dL — ABNORMAL HIGH (ref 70–99)
Glucose-Capillary: 201 mg/dL — ABNORMAL HIGH (ref 70–99)
Glucose-Capillary: 59 mg/dL — ABNORMAL LOW (ref 70–99)
Glucose-Capillary: 133 mg/dL — ABNORMAL HIGH (ref 70–99)
Glucose-Capillary: 168 mg/dL — ABNORMAL HIGH (ref 70–99)
Glucose-Capillary: 259 mg/dL — ABNORMAL HIGH (ref 70–99)

## 2021-04-03 LAB — HEMOGLOBIN A1C
Hgb A1c MFr Bld: 12.5 % — ABNORMAL HIGH (ref 4.8–5.6)
Mean Plasma Glucose: 312.05 mg/dL

## 2021-04-03 LAB — PROTIME-INR
INR: 1 (ref 0.8–1.2)
Prothrombin Time: 13.4 seconds (ref 11.4–15.2)

## 2021-04-03 LAB — APTT: aPTT: 29 seconds (ref 24–36)

## 2021-04-03 LAB — BETA-HYDROXYBUTYRIC ACID: Beta-Hydroxybutyric Acid: 0.16 mmol/L (ref 0.05–0.27)

## 2021-04-03 LAB — RESP PANEL BY RT-PCR (FLU A&B, COVID) ARPGX2
Influenza A by PCR: NEGATIVE
Influenza B by PCR: NEGATIVE
SARS Coronavirus 2 by RT PCR: NEGATIVE

## 2021-04-03 LAB — LIPASE, BLOOD: Lipase: 19 U/L (ref 11–51)

## 2021-04-03 LAB — ETHANOL: Alcohol, Ethyl (B): <10 mg/dL (ref <10)

## 2021-04-03 MED ORDER — INSULIN GLARGINE-YFGN 100 UNIT/ML ~~LOC~~ SOLN
10.0000 [IU] | Freq: Every day | SUBCUTANEOUS | Status: DC
  Administered 2021-04-03: 10 [IU] via SUBCUTANEOUS
  Filled 2021-04-03 (×2): qty 0.1

## 2021-04-03 MED ORDER — LACTATED RINGERS IV BOLUS
20.0000 mL/kg | Freq: Once | INTRAVENOUS | Status: AC
  Administered 2021-04-03: 2042 mL via INTRAVENOUS

## 2021-04-03 MED ORDER — DEXTROSE IN LACTATED RINGERS 5 % IV SOLN: INTRAVENOUS | Status: DC

## 2021-04-03 MED ORDER — DEXTROSE 50 % IV SOLN
0.0000 mL | INTRAVENOUS | Status: DC | PRN
  Administered 2021-04-03: 50 mL via INTRAVENOUS
  Filled 2021-04-03: qty 50

## 2021-04-03 MED ORDER — ASPIRIN EC 81 MG PO TBEC
81.0000 mg | DELAYED_RELEASE_TABLET | Freq: Every day | ORAL | Status: DC
  Administered 2021-04-03 – 2021-04-05 (×3): 81 mg via ORAL
  Filled 2021-04-03 (×3): qty 1

## 2021-04-03 MED ORDER — LACTATED RINGERS IV SOLN: INTRAVENOUS | Status: DC

## 2021-04-03 MED ORDER — INSULIN REGULAR(HUMAN) IN NACL 100-0.9 UT/100ML-% IV SOLN
INTRAVENOUS | Status: DC
  Administered 2021-04-03: 15 [IU]/h via INTRAVENOUS
  Filled 2021-04-03: qty 100

## 2021-04-03 MED ORDER — SODIUM CHLORIDE 0.9 % IV BOLUS
1000.0000 mL | Freq: Once | INTRAVENOUS | Status: AC
  Administered 2021-04-03: 1000 mL via INTRAVENOUS

## 2021-04-03 MED ORDER — SODIUM CHLORIDE 0.9 % IV BOLUS: 500.0000 mL | Freq: Once | INTRAVENOUS | Status: DC

## 2021-04-03 MED ORDER — APIXABAN 5 MG PO TABS
5.0000 mg | ORAL_TABLET | Freq: Two times a day (BID) | ORAL | Status: DC
  Administered 2021-04-03 – 2021-04-05 (×4): 5 mg via ORAL
  Filled 2021-04-03 (×4): qty 1

## 2021-04-03 MED ORDER — SODIUM CHLORIDE 0.9 % IV SOLN: 100.0000 mL/h | INTRAVENOUS | Status: DC

## 2021-04-03 MED ORDER — IOHEXOL 350 MG/ML SOLN
75.0000 mL | Freq: Once | INTRAVENOUS | Status: AC | PRN
  Administered 2021-04-03: 75 mL via INTRAVENOUS

## 2021-04-03 NOTE — H&P (Addendum)
Family Medicine Teaching [facility] Admission History and Physical Service Pager: [phone]  Patient name: Bruce Vega Medical record number: [id number] Date of birth: 1980/10/03 Age: 40 y.o. Gender: male  Primary Care Provider: Pcp, No Consultants: Neurology  Code Status: Full  Preferred Emergency Contact: Mahamadou Weltz [phone]  Chief Complaint: R hand weakness  Assessment and Plan: El Pile is a 40 y.o. male presenting with R hand weakness. PMH is significant for previous CVAs, T2DM and HLD.   Right hand weakness  Occlusion of vertebral artery Patient with history of prior CVA presents after experiencing right-sided hand weakness earlier that resolved within an hour. At baseline he has right sided weakness and loss of sensation due to prior strokes. Today CT head notable for old superior cerebellar infarction of the right with old small right posterior parietal cortical infarction with old left posterior frontal infarction without acute finding. CTA demonstrated no evidence of acute intracranial embolic occlusion with normal appearing carotid bifurcations with occluded left vertebral artery, no flow seen anywhere along proximal course of the vessel with minimal retrograde flow in the distal vertebral artery back to PICA. MRI w/o acute findings. Home medications include eliquis and aspirin. Likely secondary to TIA as infarct not notable on imaging and given rapid resolution of symptoms. Will appreciate further recommendations from Neurology.  -Admit to FPTS, attending Dr. Jennette Kettle -neurology consulted, appreciate recs -continue aspirin 81 + eliquis -f/u am labs -f/u TSH, A1c, lipid panel -Consider TTE -neuro checks q4h -PT/OT eval and treat -continuous telemetry  -permissive hypertension SBP goal <180, per neuro -up with assistance  Hyperglycemia in the setting of T2DM Admitted with glucose of 562 without anion gap, reports that he has been intermittently  compliant with his diabetic regimen of home insulin of 12 units novolog tid , with 32 units of lantus qhs. Started on endotool, CBG trended down to 168. -d/c endotool -lantus 10 units -monitor CBGs  -BMP q4h -heart healthy/carb modified diet   Hyponatremia  Na 128 on admission, likely secondary to hyperglycemia. Corrected to Na 135. -monitor with BMP -monitor for mental status changes  Hyperkalemia  K 5.3 on admission and on repeat is 4.5.  -BMP q4h -am EKG  AKI Cr 1.54 on admission, likely secondary to prerenal etiology. Baseline appears to be 1.0-1.17.  -125 mL/hr LR  -am BMP -encourage oral hydration   Glucosuria  UA notable for glucose >500, negative for ketones. Expected to resolve as hyperglycemia resolves.   FEN/GI: heart healthy/carb modified  Prophylaxis: Eliquis  Disposition: admit to med telemetry, attending Dr. Jennette Kettle  History of Present Illness:  Bruce Vega is a 40 y.o. male presenting with right hand weakness lasting less than an hour, with repeat episode later in the day. He was standing at the bank at the time of this episode. Was able to call a friend who helped him get to the hospital. States he has a history of 2 prior strokes and 2 heart surgeries. He has chronic shoulder pain but denies any other discomfort at this time. Denies family hx of heart disease, HLD, and HTN.   Review Of Systems: Per HPI with the following additions:  Review of Systems  Constitutional:  Positive for activity change.  Eyes:  Negative for visual disturbance.  Cardiovascular:  Negative for chest pain.  Gastrointestinal:  Negative for abdominal pain.  Neurological:  Positive for speech difficulty, weakness and numbness. Negative for dizziness, tremors and facial asymmetry.  Psychiatric/Behavioral:  Negative for confusion.     Patient Active  Problem List   Diagnosis Date Noted   Hyperglycemia due to diabetes mellitus (HCC) 04/03/2021   Discoordination    Occlusion of left  vertebral artery     Past Medical History: Past Medical History:  Diagnosis Date   Diabetes mellitus without complication (HCC)    Stroke [facility])     Past Surgical History: Past Surgical History:  Procedure Laterality Date   CARDIAC SURGERY     CATARACT EXTRACTION      Social History: Social History   Tobacco Use   Smoking status: Never  Substance Use Topics   Alcohol use: No   Drug use: No    Please also refer to relevant sections of EMR.  Family History: History reviewed. No pertinent family history. DM2 in great grandma, no other chronic medical conditions in family   Allergies and Medications: Allergies  Allergen Reactions   Pineapple Swelling and Other (See Comments)    Oropharyngeal swelling    No current facility-administered medications on file prior to encounter.   Current Outpatient Medications on File Prior to Encounter  Medication Sig Dispense Refill   acetaminophen (TYLENOL) 325 MG tablet Take 650 mg by mouth every 6 (six) hours as needed for mild pain or headache.     aspirin EC 81 MG tablet Take 81 mg by mouth daily. Swallow whole.     Continuous Blood Gluc Sensor (FREESTYLE LIBRE 14 DAY SENSOR) MISC Inject 1 Device into the skin every 14 (fourteen) days.     ELIQUIS 5 MG TABS tablet Take 5 mg by mouth in the morning and at bedtime.     LANTUS SOLOSTAR 100 UNIT/ML Solostar Pen Inject 32 Units into the skin at bedtime.     nitroGLYCERIN (NITROSTAT) 0.4 MG SL tablet Place 0.4 mg under the tongue every 5 (five) minutes as needed for chest pain.     NOVOLOG FLEXPEN 100 UNIT/ML FlexPen Inject 12 Units into the skin 3 (three) times daily with meals.     HYDROcodone-acetaminophen (NORCO/VICODIN) 5-325 MG tablet Take 2 tablets by mouth every 4 (four) hours as needed. (Patient not taking: No sig reported) 10 tablet 0   insulin aspart (NOVOLOG) 100 UNIT/ML injection Inject 2 Units into the skin 3 (three) times daily before meals. (Patient not taking: Reported  on 04/03/2021) 1 vial 12   oxyCODONE-acetaminophen (PERCOCET) 5-325 MG tablet Take 1 tablet by mouth every 4 (four) hours as needed for severe pain. (Patient not taking: Reported on 04/03/2021) 20 tablet 0    Objective: BP (!) 139/100   Pulse 86   Temp 98 F (36.7 C) (Oral)   Resp 20   Wt 102.1 kg   SpO2 100%   BMI 31.38 kg/m  Exam: General: Obese, African American male, alert and interactive Eyes: Conjunctiva clear, right eye with improved red reflex as compared to left ENTM: No lymphadenopathy Neck: Soft, normal range of motion Cardiovascular: RRR, NRMG Respiratory: CTABL, no wheezes or stridor Gastrointestinal: Abdomen soft, non-tender, non-distended MSK: Normal range of motion, globally Derm: No rashes or lesions Neuro: A&O x4. Slurred speech at baseline. Right sided weakness throughout. 2+ grip strength on the left, 1+ on the right. Decreased sensation on right. Normal pupillary reflex Psych: Good mood, jovial affect  Labs and Imaging: CBC BMET  Recent Labs  Lab 04/03/21 1214 04/03/21 1229 04/03/21 1311  WBC 5.0  --   --   HGB 14.7   < > 17.0  HCT 47.9   < > 50.0  PLT 328  --   --    < > =  values in this interval not displayed.   Recent Labs  Lab 04/03/21 1450  NA 130*  K 4.5  CL 98  CO2 19*  BUN 14  CREATININE 1.19  GLUCOSE 427*  CALCIUM 8.8*     EKG:  Normal sinus with prolonged PR interval  CT HEAD WITHOUT CONTRAST COMPARISON:  None.  IMPRESSION: No acute finding. Old superior cerebellar infarction on the right. Old small right posterior parietal cortical infarction. Old left posterior frontal infarction.  CT ANGIOGRAPHY HEAD AND NECK COMPARISON:  Head CT earlier same day. IMPRESSION: No evidence of acute intracranial embolic occlusion. Carotid bifurcations appear normal. Occluded left vertebral artery. No flow seen anywhere along proximal course of the vessel. Minimal retrograde flow in the distal vertebral artery back to PICA.  MRI HEAD  WITHOUT CONTRAST COMPARISON:  Prior CTs from earlier the same day. IMPRESSION: 1. No acute intracranial infarct or other abnormality. 2. Remote infarcts involving the left frontal lobe, right frontal lobe, right occipital lobe, and right cerebellum. 3. 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  Bess Kinds, MD 04/03/2021, 9:39 PM PGY-1, [place] [facility] FPTS Intern pager: [phone], text pages welcome  FPTS Upper-Level Resident Addendum   I have independently interviewed and examined the patient. I have discussed the above with the original author and agree with their documentation. My edits for correction/addition/clarification are in within the document. Please see also any attending notes.   Lavonda Jumbo, DO PGY-3, [place] [facility] 04/04/2021 12:30 AM  FPTS Service pager: [phone] (text pages welcome through [facility])

## 2021-04-03 NOTE — ED Notes (Signed)
RN got report from prior RN Josh while pt was in MRI. MRI called asking this RN to retrieve pt and change over his fluids from MRI pump. RN retrieved pt, found to have insulin at 5.5units/hr and D5LR@125mL /hr running in MRI. RN checked pt glucose on arrival to room and found to be 59; immediately stopped insulin gtt at that time. Pt reports that during MRI his R hand weakness returned, and pt currently unable to open fingers on R hand. RN paged family med residents to inform them that pt had returned to room per their request, update on pt status on arrival. Pt remains GCS 15, denies additional weakness/numbness/tingling. RN administered endotool recommended dose and attached pt to monitors. VSS, no distress noted at this time, awaiting family med residents.

## 2021-04-03 NOTE — Code Documentation (Signed)
Stroke Response Nurse Documentation Code Documentation  Bruce Vega is a 40 y.o. male arriving to [facility] ED via Private Vehicle on 04/03/21 with past medical hx of DM, multiple prior strokes. On Eliquis (apixaban) daily. Code stroke was activated by ED.   Patient from [place] with wife where he was LKW at 1134 and now complaining of slurred speech, right hand weakness, and decreased sensation on right side.   Patient seen by EDP Jeraldine Loots, Code Stroke activated and pt taken to CT. Stroke team at pt bedside in CT. NIHSS 2, see documentation for details and code stroke times. Patient with right arm weakness, right leg weakness, and right decreased sensation on exam. The following imaging was completed: CT, CTA head and neck. Patient is not a candidate for IV Thrombolytic due to too mild to treat.   Care/Plan: q2h mNIHSS, SBP <180.   Bedside handoff with ED RN Hope.    Scarlette Slice K  Stroke Response RN

## 2021-04-03 NOTE — ED Notes (Signed)
Hospitalist at bedside assessing pt.

## 2021-04-03 NOTE — ED Notes (Signed)
Neurology paged to Dr. Jeraldine Loots per his request

## 2021-04-03 NOTE — ED Notes (Signed)
RN also secure chatted Dr Thomasena Edis to give status update on hand weakness return. Unable to offer further intervention due to pt's coagulated state, so no additional orders at this time. Still waiting for family med residents.

## 2021-04-03 NOTE — Consult Note (Signed)
Neurology Consult H&P  Bruce Vega MR# [id number] 04/03/2021   CC: right hand weakness  History is obtained from: patient and partner  HPI: Bruce Vega is a 40 y.o. male right handed PMHx as reviewed below, 3 strokes the last being in November 2021 and previously on rivaroxaban then switched to apixaban developed right hand weakness 60 minutes prior to arrival. He was at his bank around 11:30 and he noticed that he was unable to grip with his right hand. Residual weakness in his right and at baseline however now he cannot extend his fingers or hold things in his right hand. He is adherent with apixaban. Denies numbness. He denies numbness and weakness in his lower extremities.  No f/c/n/v/cp.    LKW: 11:30 tNK given: No not candidate IR Thrombectomy No Modified Rankin Scale: 1-No significant post stroke disability and can perform usual duties with stroke symptoms NIHSS: 0  ROS: A complete ROS was performed and is negative except as noted in the HPI.  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  S/P CABG x 1  Had coronary dissection 2011 unclear cause  Salmonella enteritis   Past Surgical History   Coronary artery bypass graft 2011 spontaneous LM disection s/p emergency CABG with SVG x3 to LAD, Diag and OM1   Family History  Problem Relation Age of Onset   Lupus Mother   Deep vein thrombosis Mother   No Known Problems Father   Social History   Number of children: 1   Occupational History   [employer]   Social History Main Topics   Smoking status: Never Smoker   Smokeless tobacco: Never Used   Alcohol use  No   Drug use: No   Sexual activity: Yes  Partners: Female   Social History Narrative  Never Drank Alcohol    Medical History Medical History Date Comments  Stroke (CMS/HCC)      Diabetes mellitus (CMS/HCC)      Diabetic retinopathy associated with type 2 diabetes mellitus (CMS/HCC)      Acute kidney injury (CMS/HCC)      Impaired mobility      DKA (diabetic ketoacidosis) (CMS/HCC)      NSTEMI (non-ST elevated myocardial infarction) (CMS/HCC)        Prior to Admission medications   Medication Sig Start Date End Date Taking? Authorizing Provider  HYDROcodone-acetaminophen (NORCO/VICODIN) 5-325 MG tablet Take 2 tablets by mouth every 4 (four) hours as needed. 06/15/15   Eber Hong, MD  insulin aspart (NOVOLOG) 100 UNIT/ML injection Inject 2 Units into the skin 3 (three) times daily before meals. Patient taking differently: Inject 1-5 Units into the skin 3 (three) times daily before meals. Sliding scale 03/14/13   Earley Favor, NP  insulin detemir (LEVEMIR) 100 UNIT/ML injection Inject 35 Units into the skin 2 (two) times daily. 05/11/14 06/14/15  [provider]  oxyCODONE-acetaminophen (PERCOCET) 5-325 MG tablet Take 1 tablet by mouth every 4 (four) hours as needed for severe pain. Patient not taking: Reported on 06/14/2015 05/09/15   Mancel Bale, MD  rivaroxaban (XARELTO) 20 MG TABS tablet Take 20 mg by mouth daily. 05/11/14   [provider]  Exam: Current vital signs: BP (!) 146/117   Pulse 81   Temp 98 F (36.7 C) (Oral)   Resp 18   SpO2 97%   Physical Exam  Constitutional: Appears well-developed and well-nourished.  Psych: Affect appropriate to situation Eyes: No scleral injection HENT: No OP obstruction. Head: Normocephalic.  Cardiovascular: Normal rate and regular rhythm.  Respiratory: Effort normal, symmetric excursions bilaterally, no audible wheezing. GI: Soft.  No distension. There is no tenderness.  Skin: WDI  Neuro: Mental  Status: Patient is awake, alert, oriented to person, place, month, year, and situation. Patient is able to give a clear and coherent history. Speech fluent, intact comprehension and repetition. No signs of aphasia or neglect. Visual Fields are full. Pupils are equal, round, and reactive to light. EOMI without ptosis or diploplia.  Facial sensation is symmetric to temperature Facial movement is symmetric.  Hearing is intact to voice. Uvula midline and palate elevates symmetrically. Shoulder shrug is symmetric. Tongue is midline without atrophy or fasciculations.  Tone is normal. Bulk is normal. Wrist extension 3-/5. Right hand is flexed at rest with extension 2-/5, grip 3-/5. Sensation is symmetric to light touch and temperature in the arms and legs. Deep Tendon Reflexes: 2+ and symmetric in the biceps and patellae. Toes are downgoing bilaterally. FNF and HKS are intact bilaterally. Gait - Deferred  I have reviewed labs in epic and the pertinent results are:  Ref. Range 04/03/2021 13:11  Sodium Latest Ref Range: 135 - 145 mmol/L 127 (L)  Potassium Latest Ref Range: 3.5 - 5.1 mmol/L 5.7 (H)  Chloride Latest Ref Range: 98 - 111 mmol/L 104  Glucose Latest Ref Range: 70 - 99 mg/dL 630 (HH)    I have reviewed the images obtained: NCT head showed No acute finding. Old superior cerebellar infarction on the right. Old small right posterior parietal cortical infarction. Old left posterior frontal infarction. CTA head and neck showed No evidence of acute intracranial embolic occlusion. Carotid bifurcations appear normal. Occluded left vertebral artery. No flow seen anywhere along proximal course of the vessel. Minimal retrograde flow in the distal vertebral artery back to PICA.  Assessment: Bruce Vega is a 40 y.o. male right handed PMHx as above significant vascular risk factors previous strokes (on apixaban unclear reason) with acute focal weakness in his right hand extension > flexion.  Presentation suggests hand knob gyrus affected/cortical hand syndrome. CTA did not show significant carotid stenosis. He will need admission and further evaluation.  Impression:  acute ischemic stroke Right hand weakness NIHSS 0 On apixaban  Plan: - MRI brain without contrast. - Recommend TTE. - Recommend labs: HbA1c, lipid panel, TSH. - Recommend Statin if LDL > 70 - Continue apixaban. - SBP goal <180. - Telemetry monitoring for arrhythmia. - Recommend bedside Swallow screen. - Recommend Stroke education. - Recommend PT/OT/SLP consult.  This patient is critically ill and at significant risk of neurological worsening, death and care requires constant monitoring of vital signs, hemodynamics,respiratory and cardiac monitoring, neurological assessment, discussion with family, other specialists and medical decision making of high complexity. I spent 73 minutes of neurocritical care time  in the care of  this patient. This was time spent independent of any time provided by nurse practitioner or PA.  Electronically signed by:  Marisue Humble, MD Page: [phone] 04/03/2021, 1:57 PM

## 2021-04-03 NOTE — ED Provider Notes (Signed)
[facility] EMERGENCY DEPARTMENT Provider Note   CSN: [id number] Arrival date & time: 04/03/21  1153  An emergency department physician performed an initial assessment on this suspected stroke patient at 1233 (stroke called at this time).  History Chief Complaint  Patient presents with   Weakness   Code Stroke    Darion Milewski is a 40 y.o. male.  HPI Patient with history of diabetes, stroke,, hyperlipidemia presents with new right hand weakness.  He is here with his male companion who assists with history.  He notes that he was in his usual state of health until about 1 hour prior to ED arrival.  He noticed sudden onset difficulty with coordination in the right hand, isolated. No pain, no weakness, no facial asymmetry.  He believes that his speech is normal.  His male companion states that she may perceive some slurring. Patient's history of most recent stroke was perioperative during cardiac surgery.  This was diagnosed at another healthcare facility.  He notes that he has been compliant with his medication, including Eliquis.     Past Medical History:  Diagnosis Date   Diabetes mellitus without complication (HCC)    Stroke (HCC)     There are no problems to display for this patient.   Past Surgical History:  Procedure Laterality Date   CARDIAC SURGERY     CATARACT EXTRACTION         History reviewed. No pertinent family history.  Social History   Tobacco Use   Smoking status: Never  Substance Use Topics   Alcohol use: No   Drug use: No    Home Medications Prior to Admission medications   Medication Sig Start Date End Date Taking? Authorizing Provider  HYDROcodone-acetaminophen (NORCO/VICODIN) 5-325 MG tablet Take 2 tablets by mouth every 4 (four) hours as needed. 06/15/15   Eber Hong, MD  insulin aspart (NOVOLOG) 100 UNIT/ML injection Inject 2 Units into the skin 3 (three) times daily before meals. Patient taking  differently: Inject 1-5 Units into the skin 3 (three) times daily before meals. Sliding scale 03/14/13   Earley Favor, NP  insulin detemir (LEVEMIR) 100 UNIT/ML injection Inject 35 Units into the skin 2 (two) times daily. 05/11/14 06/14/15  [provider]  oxyCODONE-acetaminophen (PERCOCET) 5-325 MG tablet Take 1 tablet by mouth every 4 (four) hours as needed for severe pain. Patient not taking: Reported on 06/14/2015 05/09/15   Mancel Bale, MD  rivaroxaban (XARELTO) 20 MG TABS tablet Take 20 mg by mouth daily. 05/11/14   [provider]    Allergies    Pineapple  Review of Systems   Review of Systems  Constitutional:        Per HPI, otherwise negative  HENT:         Per HPI, otherwise negative  Respiratory:         Per HPI, otherwise negative  Cardiovascular:        Per HPI, otherwise negative  Gastrointestinal:  Negative for vomiting.  Endocrine:       Negative aside from HPI  Genitourinary:        Neg aside from HPI   Musculoskeletal:        Per HPI, otherwise negative  Skin: Negative.   Neurological:  Positive for weakness. Negative for syncope.   Physical Exam Updated Vital Signs BP (!) 138/116   Pulse 84   Temp 98 F (36.7 C) (Oral)   Resp (!) 22   SpO2 97%  Physical Exam Vitals and nursing note reviewed.  Constitutional:      General: He is not in acute distress.    Appearance: He is well-developed.  HENT:     Head: Normocephalic and atraumatic.  Eyes:     Conjunctiva/sclera: Conjunctivae normal.  Cardiovascular:     Rate and Rhythm: Normal rate and regular rhythm.  Pulmonary:     Effort: Pulmonary effort is normal. No respiratory distress.     Breath sounds: No stridor.  Abdominal:     General: There is no distension.  Skin:    General: Skin is warm and dry.  Neurological:     Mental Status: He is alert and oriented to person, place, and time.     Cranial Nerves: Cranial nerves are intact.     Comments: 4/5 grip strength  bilaterally.  Right hand, however, has negligible capacity for extension of any digit, wrist.  Neurologic exam otherwise unremarkable.    ED Results / Procedures / Treatments   Labs (all labs ordered are listed, but only abnormal results are displayed) Labs Reviewed  COMPREHENSIVE METABOLIC PANEL - Abnormal; Notable for the following components:      Result Value   Sodium 128 (*)    Potassium 5.3 (*)    Chloride 94 (*)    CO2 21 (*)    Glucose, Bld 562 (*)    Creatinine, Ser 1.54 (*)    GFR, Estimated 59 (*)    All other components within normal limits  CBC WITH DIFFERENTIAL/PLATELET - Abnormal; Notable for the following components:   MCH 25.6 (*)    All other components within normal limits  CBG MONITORING, ED - Abnormal; Notable for the following components:   Glucose-Capillary 497 (*)    All other components within normal limits  I-STAT CHEM 8, ED - Abnormal; Notable for the following components:   Sodium 127 (*)    Potassium 5.7 (*)    BUN 22 (*)    Creatinine, Ser 1.40 (*)    Glucose, Bld 588 (*)    Calcium, Ion 0.91 (*)    TCO2 18 (*)    All other components within normal limits  I-STAT VENOUS BLOOD GAS, ED - Abnormal; Notable for the following components:   pH, Ven 7.471 (*)    pCO2, Ven 27.5 (*)    pO2, Ven 99.0 (*)    TCO2 21 (*)    Sodium 127 (*)    Potassium 5.3 (*)    Calcium, Ion 0.93 (*)    All other components within normal limits  RESP PANEL BY RT-PCR (FLU A&B, COVID) ARPGX2  ETHANOL  LIPASE, BLOOD  PROTIME-INR  APTT  URINALYSIS, ROUTINE W REFLEX MICROSCOPIC  BLOOD GAS, VENOUS  RAPID URINE DRUG SCREEN, HOSP PERFORMED  BASIC METABOLIC PANEL  BASIC METABOLIC PANEL  BASIC METABOLIC PANEL  BASIC METABOLIC PANEL  BETA-HYDROXYBUTYRIC ACID  BETA-HYDROXYBUTYRIC ACID    EKG None  Radiology CT HEAD WO CONTRAST  Result Date: 04/03/2021 CLINICAL DATA:  Neurological deficit. Acute stroke suspected. Right upper extremity weakness. EXAM: CT HEAD WITHOUT  CONTRAST TECHNIQUE: Contiguous axial images were obtained from the base of the skull through the vertex without intravenous contrast. COMPARISON:  None. FINDINGS: Brain: There is been an old infarction in the right superior cerebellum. No acute posterior fossa finding. There is an old infarction at the right parietooccipital junction. There is an old infarction in the left posterior frontal lobe. No discernible acute infarction, mass lesion, hemorrhage, hydrocephalus or extra-axial collection.  Vascular: Negative Skull: Normal Sinuses/Orbits: Clear/normal Other: None IMPRESSION: No acute finding. Old superior cerebellar infarction on the right. Old small right posterior parietal cortical infarction. Old left posterior frontal infarction. These results were communicated to Dr. Thomasena Edis At 12:47 pm on 04/03/2021 by text page via the [facility] messaging system. Electronically Signed   By: Paulina Fusi M.D.   On: 04/03/2021 12:48   CT ANGIO HEAD NECK W WO CM (CODE STROKE)  Result Date: 04/03/2021 CLINICAL DATA:  Stroke suspected.  Right hand weakness. EXAM: CT ANGIOGRAPHY HEAD AND NECK TECHNIQUE: Multidetector CT imaging of the head and neck was performed using the standard protocol during bolus administration of intravenous contrast. Multiplanar CT image reconstructions and MIPs were obtained to evaluate the vascular anatomy. Carotid stenosis measurements (when applicable) are obtained utilizing NASCET criteria, using the distal internal carotid diameter as the denominator. CONTRAST:  56mL OMNIPAQUE IOHEXOL 350 MG/ML SOLN COMPARISON:  Head CT earlier same day. FINDINGS: CTA NECK FINDINGS Aortic arch: Previous median sternotomy. No acute pathology of the aortic arch is identified. Right carotid system: Common carotid artery is widely patent to the bifurcation. Carotid bifurcation shows minimal soft plaque but no stenosis. Cervical ICA widely patent. Left carotid system: Common carotid artery widely patent to the  bifurcation. Carotid bifurcation is normal. Cervical ICA is normal. Vertebral arteries: Right vertebral artery origin is widely patent in the right vertebral artery is patent through the cervical region to the foramen magnum. No flow is seen in the left vertebral artery anywhere in the region. Skeleton: Normal Other neck: No soft tissue neck mass or adenopathy. Upper chest: Lung apices are clear. Previous median sternotomy as noted above. Review of the MIP images confirms the above findings CTA HEAD FINDINGS Anterior circulation: Both internal carotid arteries are patent through the skull base and siphon regions. Anterior and middle cerebral vessels are patent. No sign of acute embolic occlusion. Posterior circulation: Right vertebral artery widely patent to the basilar. No left vertebral artery flow is identified at the foramen magnum level. There is probably minimal retrograde flow back to left PICA. No basilar stenosis is seen. Flow is present in both superior cerebellar and posterior cerebral arteries. Posterior cerebral arteries receive most of there supply from the anterior circulation. Venous sinuses: Patent and normal. Anatomic variants: None significant. Review of the MIP images confirms the above findings IMPRESSION: No evidence of acute intracranial embolic occlusion. Carotid bifurcations appear normal. Occluded left vertebral artery. No flow seen anywhere along proximal course of the vessel. Minimal retrograde flow in the distal vertebral artery back to PICA. These results were communicated to Dr. Thomasena Edis At 1:02 pm on 04/03/2021 by text page via the [facility] messaging system. Electronically Signed   By: Paulina Fusi M.D.   On: 04/03/2021 13:02    Procedures Procedures   Medications Ordered in ED Medications  sodium chloride 0.9 % bolus 500 mL (has no administration in time range)    Followed by  0.9 %  sodium chloride infusion (has no administration in time range)  lactated ringers bolus 20 mL/kg  (has no administration in time range)  insulin regular, human (MYXREDLIN) 100 units/ 100 mL infusion (has no administration in time range)  lactated ringers infusion (has no administration in time range)  dextrose 5 % in lactated ringers infusion (has no administration in time range)  dextrose 50 % solution 0-50 mL (has no administration in time range)  sodium chloride 0.9 % bolus 1,000 mL (1,000 mLs Intravenous New Bag/Given 04/03/21 1300)  iohexol (OMNIPAQUE) 350 MG/ML injection 75 mL (75 mLs Intravenous Contrast Given 04/03/21 1252)    ED Course  I have reviewed the triage vital signs and the nursing notes.  Pertinent labs & imaging results that were available during my care of the patient were reviewed by me and considered in my medical decision making (see chart for details).  With consideration patient's history of prior stroke, is new isolated right hand lesion I discussed his case with our neurologist after my initial evaluation. 12:33 PM Patient designated as a code stroke.  2:15 PM Chemistry panel available.  Notable for substantial hyperglycemia, corrected anion gap of 25.  Head CT is reviewed, considered with neurology no obvious stroke, MRI pending.  Patient's hand extension deficiency has improved substantially.  Patient will require admission for ongoing therapy for his discoordination and diabetic ketoacidosis. MDM Rules/Calculators/A&P MDM Number of Diagnoses or Management Options Diabetic ketoacidosis without coma associated with type 1 diabetes mellitus (HCC): new, needed workup Discoordination: new, needed workup Occlusion of left vertebral artery: new, needed workup   Amount and/or Complexity of Data Reviewed Clinical lab tests: ordered and reviewed Tests in the radiology section of CPT: ordered and reviewed Tests in the medicine section of CPT: reviewed and ordered Decide to obtain previous medical records or to obtain history from someone other than the patient:  yes Obtain history from someone other than the patient: yes Review and summarize past medical records: yes Discuss the patient with other providers: yes Independent visualization of images, tracings, or specimens: yes  Risk of Complications, Morbidity, and/or Mortality Presenting problems: high Diagnostic procedures: high Management options: high  Critical Care Total time providing critical care: 30-74 minutes (40)  Patient Progress Patient progress: stable   Final Clinical Impression(s) / ED Diagnoses Final diagnoses:  Diabetic ketoacidosis without coma associated with type 1 diabetes mellitus (HCC)  Discoordination  Occlusion of left vertebral artery     Gerhard Munch, MD 04/03/21 1517

## 2021-04-03 NOTE — ED Triage Notes (Signed)
Pt states around 11:30 this morning he was at the bank and he realized he was unable to use his right hand. Pt has had 3 strokes since November of last year. His right has is normally weaker than his left hand. But he can barely grip at this time. Denies any weakness in right leg. No facial droop. Pt states he feels like his speech is slightly slurred, but pt's wife states it sounds normal.

## 2021-04-04 ENCOUNTER — Inpatient Hospital Stay (HOSPITAL_COMMUNITY): Payer: Medicaid Other

## 2021-04-04 DIAGNOSIS — E1165 Type 2 diabetes mellitus with hyperglycemia: Secondary | ICD-10-CM

## 2021-04-04 DIAGNOSIS — E782 Mixed hyperlipidemia: Secondary | ICD-10-CM

## 2021-04-04 DIAGNOSIS — Z951 Presence of aortocoronary bypass graft: Secondary | ICD-10-CM

## 2021-04-04 DIAGNOSIS — E1010 Type 1 diabetes mellitus with ketoacidosis without coma: Secondary | ICD-10-CM | POA: Diagnosis not present

## 2021-04-04 DIAGNOSIS — E878 Other disorders of electrolyte and fluid balance, not elsewhere classified: Secondary | ICD-10-CM

## 2021-04-04 DIAGNOSIS — G459 Transient cerebral ischemic attack, unspecified: Secondary | ICD-10-CM | POA: Diagnosis not present

## 2021-04-04 DIAGNOSIS — I63312 Cerebral infarction due to thrombosis of left middle cerebral artery: Secondary | ICD-10-CM

## 2021-04-04 DIAGNOSIS — N182 Chronic kidney disease, stage 2 (mild): Secondary | ICD-10-CM

## 2021-04-04 DIAGNOSIS — R279 Unspecified lack of coordination: Secondary | ICD-10-CM | POA: Diagnosis not present

## 2021-04-04 DIAGNOSIS — Z8673 Personal history of transient ischemic attack (TIA), and cerebral infarction without residual deficits: Secondary | ICD-10-CM

## 2021-04-04 LAB — BASIC METABOLIC PANEL
Anion gap: 9 (ref 5–15)
Anion gap: 9 (ref 5–15)
Anion gap: 9 (ref 5–15)
Anion gap: 10 (ref 5–15)
Anion gap: 9 (ref 5–15)
BUN: 12 mg/dL (ref 6–20)
BUN: 11 mg/dL (ref 6–20)
BUN: 10 mg/dL (ref 6–20)
BUN: 10 mg/dL (ref 6–20)
BUN: 10 mg/dL (ref 6–20)
CO2: 20 mmol/L — ABNORMAL LOW (ref 22–32)
CO2: 24 mmol/L (ref 22–32)
CO2: 23 mmol/L (ref 22–32)
CO2: 24 mmol/L (ref 22–32)
CO2: 22 mmol/L (ref 22–32)
Calcium: 8.5 mg/dL — ABNORMAL LOW (ref 8.9–10.3)
Calcium: 8.8 mg/dL — ABNORMAL LOW (ref 8.9–10.3)
Calcium: 8.9 mg/dL (ref 8.9–10.3)
Calcium: 8.9 mg/dL (ref 8.9–10.3)
Calcium: 9 mg/dL (ref 8.9–10.3)
Chloride: 103 mmol/L (ref 98–111)
Chloride: 99 mmol/L (ref 98–111)
Chloride: 100 mmol/L (ref 98–111)
Chloride: 99 mmol/L (ref 98–111)
Chloride: 102 mmol/L (ref 98–111)
Creatinine, Ser: 1.04 mg/dL (ref 0.61–1.24)
Creatinine, Ser: 0.98 mg/dL (ref 0.61–1.24)
Creatinine, Ser: 1.05 mg/dL (ref 0.61–1.24)
Creatinine, Ser: 1.05 mg/dL (ref 0.61–1.24)
Creatinine, Ser: 1.02 mg/dL (ref 0.61–1.24)
GFR, Estimated: >60 mL/min (ref >60)
GFR, Estimated: >60 mL/min (ref >60)
GFR, Estimated: >60 mL/min (ref >60)
GFR, Estimated: >60 mL/min (ref >60)
GFR, Estimated: >60 mL/min (ref >60)
Glucose, Bld: 374 mg/dL — ABNORMAL HIGH (ref 70–99)
Glucose, Bld: 368 mg/dL — ABNORMAL HIGH (ref 70–99)
Glucose, Bld: 313 mg/dL — ABNORMAL HIGH (ref 70–99)
Glucose, Bld: 315 mg/dL — ABNORMAL HIGH (ref 70–99)
Glucose, Bld: 323 mg/dL — ABNORMAL HIGH (ref 70–99)
Potassium: 4.1 mmol/L (ref 3.5–5.1)
Potassium: 4.4 mmol/L (ref 3.5–5.1)
Potassium: 4.3 mmol/L (ref 3.5–5.1)
Potassium: 4 mmol/L (ref 3.5–5.1)
Potassium: 4 mmol/L (ref 3.5–5.1)
Sodium: 132 mmol/L — ABNORMAL LOW (ref 135–145)
Sodium: 132 mmol/L — ABNORMAL LOW (ref 135–145)
Sodium: 132 mmol/L — ABNORMAL LOW (ref 135–145)
Sodium: 133 mmol/L — ABNORMAL LOW (ref 135–145)
Sodium: 133 mmol/L — ABNORMAL LOW (ref 135–145)

## 2021-04-04 LAB — LIPID PANEL
Cholesterol: 249 mg/dL — ABNORMAL HIGH (ref 0–200)
HDL: 50 mg/dL (ref >40)
LDL Cholesterol: 165 mg/dL — ABNORMAL HIGH (ref 0–99)
Total CHOL/HDL Ratio: 5 RATIO
Triglycerides: 170 mg/dL — ABNORMAL HIGH (ref <150)
VLDL: 34 mg/dL (ref 0–40)

## 2021-04-04 LAB — COMPREHENSIVE METABOLIC PANEL
ALT: 13 U/L (ref 0–44)
AST: 16 U/L (ref 15–41)
Albumin: 2.8 g/dL — ABNORMAL LOW (ref 3.5–5.0)
Alkaline Phosphatase: 88 U/L (ref 38–126)
Anion gap: 12 (ref 5–15)
BUN: 10 mg/dL (ref 6–20)
CO2: 22 mmol/L (ref 22–32)
Calcium: 8.9 mg/dL (ref 8.9–10.3)
Chloride: 98 mmol/L (ref 98–111)
Creatinine, Ser: 1.02 mg/dL (ref 0.61–1.24)
GFR, Estimated: >60 mL/min (ref >60)
Glucose, Bld: 369 mg/dL — ABNORMAL HIGH (ref 70–99)
Potassium: 4.4 mmol/L (ref 3.5–5.1)
Sodium: 132 mmol/L — ABNORMAL LOW (ref 135–145)
Total Bilirubin: 0.7 mg/dL (ref 0.3–1.2)
Total Protein: 5.6 g/dL — ABNORMAL LOW (ref 6.5–8.1)

## 2021-04-04 LAB — TSH: TSH: 0.46 u[IU]/mL (ref 0.350–4.500)

## 2021-04-04 LAB — GLUCOSE, CAPILLARY
Glucose-Capillary: 325 mg/dL — ABNORMAL HIGH (ref 70–99)
Glucose-Capillary: 284 mg/dL — ABNORMAL HIGH (ref 70–99)
Glucose-Capillary: 228 mg/dL — ABNORMAL HIGH (ref 70–99)

## 2021-04-04 LAB — BETA-HYDROXYBUTYRIC ACID
Beta-Hydroxybutyric Acid: 0.21 mmol/L (ref 0.05–0.27)
Beta-Hydroxybutyric Acid: 0.12 mmol/L (ref 0.05–0.27)

## 2021-04-04 LAB — HIV ANTIBODY (ROUTINE TESTING W REFLEX): HIV Screen 4th Generation wRfx: NONREACTIVE

## 2021-04-04 LAB — CBG MONITORING, ED
Glucose-Capillary: 336 mg/dL — ABNORMAL HIGH (ref 70–99)
Glucose-Capillary: 267 mg/dL — ABNORMAL HIGH (ref 70–99)

## 2021-04-04 MED ORDER — INSULIN ASPART 100 UNIT/ML IJ SOLN
0.0000 [IU] | Freq: Three times a day (TID) | INTRAMUSCULAR | Status: DC
  Administered 2021-04-04: 12 [IU] via SUBCUTANEOUS
  Administered 2021-04-05: 8 [IU] via SUBCUTANEOUS

## 2021-04-04 MED ORDER — ACETAMINOPHEN 325 MG PO TABS
650.0000 mg | ORAL_TABLET | Freq: Four times a day (QID) | ORAL | Status: DC | PRN
  Administered 2021-04-04: 650 mg via ORAL
  Filled 2021-04-04: qty 2

## 2021-04-04 MED ORDER — INSULIN ASPART 100 UNIT/ML IJ SOLN
0.0000 [IU] | Freq: Three times a day (TID) | INTRAMUSCULAR | Status: DC
  Administered 2021-04-04: 5 [IU] via SUBCUTANEOUS

## 2021-04-04 MED ORDER — ATORVASTATIN CALCIUM 80 MG PO TABS
80.0000 mg | ORAL_TABLET | Freq: Every day | ORAL | Status: DC
  Administered 2021-04-04 – 2021-04-05 (×2): 80 mg via ORAL
  Filled 2021-04-04 (×2): qty 1

## 2021-04-04 MED ORDER — INSULIN ASPART 100 UNIT/ML IJ SOLN
6.0000 [IU] | Freq: Three times a day (TID) | INTRAMUSCULAR | Status: DC
  Administered 2021-04-04: 6 [IU] via SUBCUTANEOUS

## 2021-04-04 MED ORDER — ATORVASTATIN CALCIUM 40 MG PO TABS: 40.0000 mg | ORAL_TABLET | Freq: Every day | ORAL | Status: DC

## 2021-04-04 MED ORDER — INSULIN GLARGINE-YFGN 100 UNIT/ML ~~LOC~~ SOLN
20.0000 [IU] | Freq: Every day | SUBCUTANEOUS | Status: DC
  Administered 2021-04-04: 20 [IU] via SUBCUTANEOUS
  Filled 2021-04-04 (×4): qty 0.2

## 2021-04-04 MED ORDER — INSULIN ASPART 100 UNIT/ML IJ SOLN
12.0000 [IU] | Freq: Three times a day (TID) | INTRAMUSCULAR | Status: DC
  Administered 2021-04-04 – 2021-04-05 (×3): 12 [IU] via SUBCUTANEOUS

## 2021-04-04 NOTE — Hospital Course (Addendum)
Right hand weakness, concern for stroke Patient with reported Hx of several previous CVAs with relatively minor deficits. 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 CTH and MRI-brain were notable for three remote infarcts. On examination of the MRI-brain, the neurologist felt that there were small punctate lesions next to his previous L frontal infarct that may be responsible for his R hand weakness. Unfortunately this deficit did not resolve during the hospitalization. He had grip strength deficit and weak interosseous muscles on exam. Patient did not stay to complete echocardiography. Recommend that this be considered as an outpatient as patient did not want to stay for echo. He was discharged on his previously prescribed ASA 81 mg and Eliquis 5 mg BID. To this regimen, atorvastatin 80 mg was added.    Hyperglycemia in the setting of T2DM Patient had a blood glucose of 562 on admission. He was started on an insulin drip, which corrected his hyperglycemia. He was eventually restarted on his home regimen and consistently had blood glucoses in the 200's. He was discharged on his home insulin regimen.   Hyperlipidemia Chol of 249, TG of 170, LDL 165. Patient was started on atorvastatin 80 mg.   CABGs Patient s/p 2 CABGs, with lipoprotein A. The patient reported he had follow up arranged on 9/28 with his cardiologist. He declined a referral to the lipid clinic at [facility].    AKI (resolved) Cr of 1.54 on admission, back to baseline with Cr of 1.02.

## 2021-04-04 NOTE — Plan of Care (Signed)
On assessment this afternoon, patient reports residual R hand weakness. On exam his interosseous muscles are still markedly weak and his grip strength is poor, both of which the patient reports are new as of two days ago. Neurology stroke service contacted and updated.   Engaged the patient in motivational interviewing regarding his diabetes and medication compliance. Patient expressed that his daughter is a motivating factor for him to work to control his diabetes. He also demonstrates knowledge of the consequences of uncontrolled diabetes and lists this as another motivating factor for good glycemic control. He expresses a desire to take his insulin as prescribed to achieve better glycemic control.   Carlyn Reichert PGY-1 FPTS Intern pager: [phone], text pages welcome

## 2021-04-04 NOTE — ED Notes (Signed)
23M notified to place purple man on board so pt can go upstairs.

## 2021-04-04 NOTE — Progress Notes (Signed)
Inpatient Diabetes Program Recommendations  AACE/ADA: New Consensus Statement on Inpatient Glycemic Control (2015)  Target Ranges:  Prepandial:   less than 140 mg/dL      Peak postprandial:   less than 180 mg/dL (1-2 hours)      Critically ill patients:  140 - 180 mg/dL   Lab Results  Component Value Date   GLUCAP 267 (H) 04/04/2021   HGBA1C 12.5 (H) 04/03/2021    Review of Glycemic Control  Diabetes history: DM 2 Outpatient Diabetes medications: Lantus 32 units qhs, Humalog 12 units tid, Freestyle Libre CGM Current orders for Inpatient glycemic control:  Semglee 10 units Novolog 0-9 units tid Novolog 6 units tid meal coverage  A1c 12.5% on 9/19  Inpatient Diabetes Program Recommendations:    -  Increase Semglee to home dose 32 units -  Increase Meal coverage to 16 units tid  Spoke with pt at bedside regarding A1c level and glucose control at home. Pt reports he knows his body and is requesting to be back on at least his home regimen of insulin, pt is also requesting to be on 16 units of Novolog at meal times. Pt reports he sees his doctors frequently and has an appt with one on the 28th of this month. Pt also reports being recently referred to Endocrinology. Pt reports he was able to get his A1c down to a 9% about 1 year ago. Pt also tells me he has been through many educational classes and knows his diabetes well.   Paged FMTS and spoke with MD regarding pt requests and insulin regimen.  Thanks,  Christena Deem RN, MSN, BC-ADM Inpatient Diabetes Coordinator Team Pager [phone] (8a-5p)

## 2021-04-04 NOTE — Progress Notes (Signed)
FPTS Brief Progress Note  S: Lying in bed resting comfortably.    O: BP (!) 144/94 (BP Location: Left Arm)   Pulse 78   Temp 97.9 F (36.6 C) (Oral)   Resp 18   Ht 5\' 11"  (1.803 m)   Wt 106.5 kg   SpO2 99%   BMI 32.75 kg/m     A/P: 1. Diabetic ketoacidosis without coma associated with type 1 diabetes mellitus (HCC)  2. Discoordination  3. Occlusion of left vertebral artery  4. Hyperglycemia due to diabetes mellitus (HCC) - insulin aspart (novoLOG) injection 0-15 Units - insulin aspart (novoLOG) injection 12 Units  5. Cerebrovascular accident (CVA) due to thrombosis of left middle cerebral artery [facility]) - Ambulatory referral to Neurology  - Orders reviewed. Labs for AM ordered, which was adjusted as needed.    [facility], DO 04/04/2021, 11:50 PM PGY-3, [place] [facility] Night Resident  Please page [phone] with questions.

## 2021-04-04 NOTE — Evaluation (Signed)
Physical Therapy Evaluation Patient Details Name: Bruce Vega MRN: [id number] DOB: [birth year] Today's Date: 04/04/2021  History of Present Illness  Pt is a 40 y/o male admitted 9/19 secondary to R hand weakness. Also found to have hyperglycemia. Imaging negative for acute infarcts. PMH includes CVA X3, DM, and CAD s/p CABG.  Clinical Impression  Pt admitted secondary to problem above with deficits below. Mild deficits observed when performing DGI tasks. Requiring min guard for transfers and gait and min A for stair simulation. Recommending outpatient PT at d/c to address current deficits; pt reports he would like to go to a clinic in [place]. Will continue to follow acutely.        Recommendations for follow up therapy are one component of a multi-disciplinary discharge planning process, led by the attending physician.  Recommendations may be updated based on patient status, additional functional criteria and insurance authorization.  Follow Up Recommendations Outpatient PT (requesting a clinic in [place])    Equipment Recommendations  None recommended by PT    Recommendations for Other Services       Precautions / Restrictions Precautions Precautions: None Restrictions Weight Bearing Restrictions: No      Mobility  Bed Mobility Overal bed mobility: Modified Independent                  Transfers Overall transfer level: Needs assistance Equipment used: None Transfers: Sit to/from Stand Sit to Stand: Min guard         General transfer comment: Min guard for safety from stretcher  Ambulation/Gait Ambulation/Gait assistance: Min guard Gait Distance (Feet): 120 Feet Assistive device: None Gait Pattern/deviations: Step-through pattern;Decreased stride length Gait velocity: Decreased   General Gait Details: Mild instability noted when performing DGI tasks, but no overt LOB. Min guard for safety.  Stairs Stairs: Yes Stairs assistance: Min  assist Stair Management: One rail Right;Step to pattern Number of Stairs: 2 General stair comments: Practice stair simulation using step stool. Unsteady and requiring min A For steadying. Cues for LE sequencing.  Wheelchair Mobility    Modified Rankin (Stroke Patients Only)       Balance Overall balance assessment: Needs assistance Sitting-balance support: No upper extremity supported;Feet supported Sitting balance-Leahy Scale: Good     Standing balance support: No upper extremity supported Standing balance-Leahy Scale: Fair                   Standardized Balance Assessment Standardized Balance Assessment : Dynamic Gait Index   Dynamic Gait Index Level Surface: Normal Change in Gait Speed: Mild Impairment Gait with Horizontal Head Turns: Mild Impairment Gait with Vertical Head Turns: Mild Impairment Gait and Pivot Turn: Mild Impairment Step Over Obstacle: Mild Impairment Step Around Obstacles: Mild Impairment Steps: Moderate Impairment Total Score: 16       Pertinent Vitals/Pain Pain Assessment: No/denies pain    Home Living Family/patient expects to be discharged to:: Private residence Living Arrangements: Alone Available Help at Discharge: Friend(s) Type of Home: Apartment Home Access: Stairs to enter Entrance Stairs-Rails: Right;Left;Can reach both Secretary/administrator of Steps: 12 Home Layout: One level Home Equipment: None      Prior Function Level of Independence: Independent               Hand Dominance   Dominant Hand: Right    Extremity/Trunk Assessment   Upper Extremity Assessment Upper Extremity Assessment: Defer to OT evaluation (Notable RUE weakness, especially in R hand.)    Lower Extremity Assessment Lower Extremity Assessment: RLE  deficits/detail RLE Deficits / Details: RLE grossly 4-/5 throughout. Pt reports weakness at baseline.    Cervical / Trunk Assessment Cervical / Trunk Assessment: Normal  Communication    Communication: No difficulties  Cognition Arousal/Alertness: Awake/alert Behavior During Therapy: WFL for tasks assessed/performed Overall Cognitive Status: Impaired/Different from baseline Area of Impairment: Safety/judgement                         Safety/Judgement: Decreased awareness of deficits;Decreased awareness of safety            General Comments General comments (skin integrity, edema, etc.): Pt's friend present throughout.    Exercises     Assessment/Plan    PT Assessment Patient needs continued PT services  PT Problem List Decreased strength;Decreased activity tolerance;Decreased balance;Decreased mobility;Decreased knowledge of use of DME;Decreased knowledge of precautions;Decreased safety awareness       PT Treatment Interventions DME instruction;Gait training;Stair training;Functional mobility training;Therapeutic activities;Therapeutic exercise;Balance training;Patient/family education    PT Goals (Current goals can be found in the Care Plan section)  Acute Rehab PT Goals Patient Stated Goal: to go home PT Goal Formulation: With patient Time For Goal Achievement: 04/18/21 Potential to Achieve Goals: Good    Frequency Min 3X/week   Barriers to discharge        Co-evaluation               AM-PAC PT "6 Clicks" Mobility  Outcome Measure Help needed turning from your back to your side while in a flat bed without using bedrails?: None Help needed moving from lying on your back to sitting on the side of a flat bed without using bedrails?: None Help needed moving to and from a bed to a chair (including a wheelchair)?: A Little Help needed standing up from a chair using your arms (e.g., wheelchair or bedside chair)?: A Little Help needed to walk in hospital room?: A Little Help needed climbing 3-5 steps with a railing? : A Little 6 Click Score: 20    End of Session Equipment Utilized During Treatment: Gait belt Activity Tolerance: Patient  tolerated treatment well Patient left: in bed;with call bell/phone within reach;with family/visitor present (on stretcher in ED) Nurse Communication: Mobility status PT Visit Diagnosis: Unsteadiness on feet (R26.81);Muscle weakness (generalized) (M62.81)    Time: 1210-1227 PT Time Calculation (min) (ACUTE ONLY): 17 min   Charges:   PT Evaluation $PT Eval Low Complexity: 1 Low          Cindee Salt, DPT  Acute Rehabilitation Services  Pager: [phone] Office: [phone]   Le[facility] Prom 04/04/2021, 1:40 PM

## 2021-04-04 NOTE — Plan of Care (Signed)
Spoke with diabetic coordinator, her recommendation was that the patient be restarted on his home regimen. We considered this as a team. The patient received 11U of Novolog over the past 4 hrs, most recent CBG of 267. Given this and concern patient has not been eating full meals, we will continue to assess CBG at the next fingerstick. Plan to increase basal insulin to 20U tonight.   Carlyn Reichert PGY-1 FPTS Intern pager: [phone], text pages welcome

## 2021-04-04 NOTE — Plan of Care (Signed)
°  Problem: Clinical Measurements: °Goal: Diagnostic test results will improve °Outcome: Progressing °  °Problem: Elimination: °Goal: Will not experience complications related to urinary retention °Outcome: Progressing °  °

## 2021-04-04 NOTE — Progress Notes (Addendum)
STROKE TEAM PROGRESS NOTE   ATTENDING NOTE: I reviewed above note and agree with the assessment and plan. Pt was seen and examined.   40 year old male with history of diabetes, CAD status post CABG in 2010, redo 2021, HLD, renal infarct, right lower extremity DVT, initially on Xarelto later on changed to Eliquis, history of stroke admitted for right hand weakness.  Per chart review, patient had right-sided weakness after redoof CABG in 05/2020, found to have left frontal and right cerebellum infarct.  In 06/2020 patient had left-sided weakness admitted in [facility] found to have left cerebellar infarct.  He was discharged on Eliquis and aspirin 81.  Yesterday he had right hand weakness lasted for about 60 minutes and resolved.  Last night while he ED, he had another episode of right hand weakness with improvement but did not back to baseline.  Currently patient still has right hand weakness, difficulty gripping with finger movement.  CT head showed chronic left frontal and right cerebellar infarcts.  CTA head and neck showed left VA occlusion with retrograde flow at a before.  MRI reported no acute infarct, but old left frontal, right occipital and right cerebellum infarcts.  However, on my review, there is suspicion of subacute punctate/tiny infarcts surrounding the prior left frontal motor strip infarcts.  2D echo pending.  LDL 165, A1c 12.5, UDS negative.  Patient came in with hyperglycemia, was put on insulin drip.  Creatinine 1.02.  On exam, patient sitting at the side of bed, eating dinner, awake alert orientated x3, no aphasia, fluent language, follows all simple commands.  No visual field deficit, no gaze palsy, facial symmetrical, bilateral upper extremity proximal 5/5, however distally right finger grip 3/5 with dexterity difficulty.  Bilateral lower extremity 5/5.  Sensation symmetrical, finger-to-nose intact.  Etiology per patient stroke likely due to small vessel disease in the setting of  uncontrolled multiple stroke risk factors.  He did have CAD status post CABG and a history of EF 40 to 45% in 06/2020, cardioembolic source cannot be completely ruled out.  However, he is on aspirin 81 and Eliquis 5 g twice daily maximized therapy, will continue at this time.  Also continue Lipitor 80.  Stroke risk factor modification, PT/OT recommend outpatient PT/OT.  For detailed assessment and plan, please refer to below as I have made changes wherever appropriate.   Neurology will sign off. Please call with questions. Pt will follow up with stroke clinic NP at [facility] in about 4 weeks. Thanks for the consult.   Marvel Plan, MD PhD Stroke Neurology 04/04/2021 7:12 PM    INTERVAL HISTORY No one at  bedside. Patient awake, alert, NAD. On the phone. Right side appears normal except for hand weakness.  Vitals:   04/04/21 0622 04/04/21 0941 04/04/21 1200 04/04/21 1303  BP: (!) 165/111 (!) 142/86 136/90 (!) 142/99  Pulse: 82 87 96 87  Resp: 13 18 20 18   Temp: 97.7 F (36.5 C)   98.3 F (36.8 C)  TempSrc: Oral   Oral  SpO2: 99% 98% 99% 100%  Weight:    106.5 kg  Height:    5\' 11"  (1.803 m)   CBC:  Recent Labs  Lab 04/03/21 1214 04/03/21 1229 04/03/21 1311  WBC 5.0  --   --   NEUTROABS 2.7  --   --   HGB 14.7 16.7 17.0  HCT 47.9 49.0 50.0  MCV 83.3  --   --   PLT 328  --   --  Basic Metabolic Panel:  Recent Labs  Lab 04/04/21 1013 04/04/21 1402  NA 132* 133*  K 4.3 4.0  CL 100 99  CO2 23 24  GLUCOSE 313* 315*  BUN 10 10  CREATININE 1.05 1.05  CALCIUM 8.9 8.9   Lipid Panel:  Recent Labs  Lab 04/03/21 2309  CHOL 249*  TRIG 170*  HDL 50  CHOLHDL 5.0  VLDL 34  LDLCALC 865*   HgbA1c:  Recent Labs  Lab 04/03/21 2309  HGBA1C 12.5*   Urine Drug Screen:  Recent Labs  Lab 04/03/21 1216  LABOPIA NONE DETECTED  COCAINSCRNUR NONE DETECTED  LABBENZ NONE DETECTED  AMPHETMU NONE DETECTED  THCU NONE DETECTED  LABBARB NONE DETECTED    Alcohol Level  Recent  Labs  Lab 04/03/21 1214  ETH <10    IMAGING past 24 hours MR Brain Wo Contrast (neuro protocol)  Result Date: 04/03/2021 CLINICAL DATA:  Initial evaluation for neuro deficit, stroke suspected, left hand weakness. EXAM: MRI HEAD WITHOUT CONTRAST TECHNIQUE: Multiplanar, multiecho pulse sequences of the brain and surrounding structures were obtained without intravenous contrast. COMPARISON:  Prior CTs from earlier the same day. FINDINGS: Brain: Cerebral volume within normal limits. Scattered patchy T2/FLAIR hyperintensity involving the periventricular deep white matter both cerebral hemispheres, most consistent with chronic small vessel ischemic disease, advanced for age. Remote hemorrhagic infarction involving the cortical/subcortical left frontal lobe noted. Additional small remote cortical infarcts noted at the right frontal and occipital lobes, with additional chronic right cerebellar infarct. No abnormal foci of restricted diffusion to suggest acute or subacute ischemia. Gray-white matter differentiation otherwise maintained. No evidence for acute intracranial hemorrhage. No mass lesion, midline shift or mass effect. No hydrocephalus or extra-axial fluid collection. Pituitary gland suprasellar region within normal limits. Midline structures intact. Vascular: Major intracranial vascular flow voids are maintained. Skull and upper cervical spine: Craniocervical junction within normal limits. Bone marrow signal intensity normal. No focal marrow replacing lesion. No scalp soft tissue abnormality. Sinuses/Orbits: Prior ocular lens replacement noted on the right. Globes and orbital soft tissues demonstrate no acute finding. Scattered mucosal thickening noted within the ethmoidal air cells and maxillary sinuses. Paranasal sinuses are otherwise clear. No mastoid effusion. Inner ear structures grossly normal. Other: None. IMPRESSION: 1. No acute intracranial infarct or other abnormality. 2. Remote infarcts  involving the left frontal lobe, right frontal lobe, right occipital lobe, and right cerebellum. 3. 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 Electronically Signed   By: Rise Mu M.D.   On: 04/03/2021 19:09    PHYSICAL EXAM Physical Exam  Constitutional: Appears well-developed and well-nourished.  Psych: Affect appropriate to situation Eyes: Normal external eye and conjunctiva. HENT: Normocephalic, no lesions, without obvious abnormality.   Musculoskeletal-no joint tenderness, deformity or swelling Cardiovascular: Normal rate and regular rhythm.  Respiratory: Effort normal, non-labored breathing saturations WNL GI: Soft.  No distension. There is no tenderness.  Skin: WDI   Neuro:  Mental Status: Alert, oriented, thought content appropriate.  Speech fluent without evidence of aphasia.  Able to follow 3 step commands without difficulty. Cranial Nerves: II: Discs flat bilaterally; Visual fields grossly normal,  III,IV, VI: ptosis not present, extra-ocular motions intact bilaterally pupils equal, round, reactive to light and accommodation V,VII: smile symmetric, facial light touch sensation normal bilaterally VIII: hearing normal bilaterally IX,X: uvula rises symmetrically XI: bilateral shoulder shrug XII: midline tongue extension Motor: Right : Upper extremity   5/5  Left:     Upper extremity   5/5  Lower extremity  5/5  Lower extremity   5/5               Hand grip 3/5  Hand grips 5/5 Patient can not tap fingers together on right side Tone and bulk:normal tone throughout; no atrophy noted Sensory: light touch intact throughout, bilaterally Cerebellar: No ataxia noted Gait: deferred     ASSESSMENT/PLAN Mr. Bruce Vega is a 40 y.o. male male right handed PMHx as reviewed below, 3 strokes the last being in November 2021 and previously on rivaroxaban then switched to apixaban developed right hand weakness 60 minutes prior to arrival. He was  at his bank around 11:30 and he noticed that he was unable to grip with his right hand. Residual weakness in his right and at baseline however now he cannot extend his fingers or hold things in his right hand. He is adherent with apixaban. Denies numbness. He denies numbness and weakness in his lower extremities.  Stroke:  left remote left and right frontal lobe infarcts as well as, right occipital lobe and right cerebellar infarct   Code Stroke CT head No acute abnormality.  Old superior cerebellar infarcts CTA head & neck Carotid bifurcations appear normal.  Occluded left vertebral artery. No flow seen anywhere along proximal course of the vessel. Minimal retrograde flow in the distal vertebral artery back to PICA.  MRI  1. No acute intracranial infarct or other abnormality. 2. Remote infarcts involving the left frontal lobe, right frontal lobe, right occipital lobe, and right cerebellum. 2D Echo pending LDL 165 HgbA1c 12.5 VTE prophylaxis - apixaban    Diet   Diet heart healthy/carb modified Room service appropriate? Yes; Fluid consistency: Thin   Eliquis (apixaban) daily prior to admission, now on Eliquis (apixaban) daily.  Therapy recommendations:  Outpatient PT Disposition:  pending  Hypertension Home meds:  none Stable Permissive hypertension (OK if < 220/120) but gradually normalize in 5-7 days Long-term BP goal normotensive  Hyperlipidemia Home meds:  none,  LDL 165, goal < 70 Add atorvastatin 80 mg daily  Continue statin at discharge  Diabetes type II Uncontrolled Home meds:  novolog flexpen HgbA1c 12.5, goal < 7.0 CBGs Recent Labs    04/04/21 1141 04/04/21 1302 04/04/21 1624  GLUCAP 267* 325* 284*    SSI  Other Stroke Risk Factors Obesity, Body mass index is 32.75 kg/m., BMI >/= 30 associated with increased stroke risk, recommend weight loss, diet and exercise as appropriate  Hx stroke   Other Active Problems  Hospital day # 1  Valentina Lucks, MSN, NP-C [facility] [phone]   To contact Stroke Continuity provider, please refer to [email or web address]. After hours, contact General Neurology

## 2021-04-04 NOTE — Progress Notes (Signed)
Family Medicine Teaching Service Daily Progress Note Intern Pager: [phone]  Patient name: Bruce Vega Medical record number: [id number] Date of birth: [birth year] Age: 40 y.o. Gender: male  Primary Care Provider: Pcp, No Consultants: Neuro Code Status: FULL  Pt Overview and Major Events to Date:  9/20: admitted, CT with no evidence of acute infarct  Assessment and Plan: Patient is a 40 year old male presenting with right hand weakness.  Past medical history significant for previous CVAs, type 2 diabetes, and hyperlipidemia.  Right hand weakness, occlusion of vertebral artery Patient with reported Hx of 3 CVAs, with residual L sided weakness. CTH and MRI-brain on admission notable for three old infarcts. CTA notable for no acute embolic occlusion, but for potential pre-existing occulusion of the left vertebral artery. Given no acute infarct and resolution of symptoms, this is most likely a TIA.  -Neuro consult, appreciate recs -neuro checks q4hrs -Continue ASA 81 mg and Eliquis 5 mg BID -Cardiac monitoring -TTE today -Permissive HTN, SBP<180 -PT/OT today  Hyperglycemia in the setting of T2DM Glc of 562 on admission, no gap, BHB WNL. Patient reports non-compliance with home regimen. A1C of 12.5. Regimen is Lantus 32U QHS, novolog 12U TID. CBGs trended down with endotool and this was discontinued along with D5LR. Most recent glc of 267 s/p 6 units of Novolog. Patient recently got another 5U of Novolog., will follow up next CBG.   -Start sensitive SSI -6U meal coverage -Increase Lantus to 20U -Heart healthy diet  Hyperlipidemia Chol of 249, TG of 170, LDL 165 -Continue atorvastatin 80 mg  CABGs Patient s/p 2 CABGs, with lipoprotein A.  -Cardiology follow up outpatient  AKI (resolved) Cr of 1.54 on admission, back to baseline now with Cr of 1.02.   FEN/GI: heart healthy diet PPx: Eliquis Dispo:likely home today  Subjective:  On interview this morning, patient is found  lying bed. He appears upset but is unwilling to share his feelings. He reports that he has outpatient follow up scheduled with both his PCP and his cardiologist through [facility], but he is unable to recall the name of his providers.   Objective: Temp:  [98 F (36.7 C)-98.7 F (37.1 C)] 98 F (36.7 C) (09/19 2005) Pulse Rate:  [78-98] 81 (09/20 0500) Resp:  [8-24] 16 (09/20 0500) BP: (120-150)/(80-117) 141/85 (09/20 0500) SpO2:  [94 %-100 %] 97 % (09/20 0500) Weight:  [225 lb (102.1 kg)-235 lb (106.6 kg)] 235 lb (106.6 kg) (09/19 2329) Physical Exam Vitals reviewed.  Cardiovascular:     Rate and Rhythm: Normal rate and regular rhythm.     Heart sounds: No murmur heard. Pulmonary:     Effort: Pulmonary effort is normal.     Breath sounds: Normal breath sounds.  Abdominal:     General: Bowel sounds are normal.     Palpations: Abdomen is soft.     Tenderness: There is no abdominal tenderness.  Neurological:     Mental Status: He is alert.     Laboratory: Recent Labs  Lab 04/03/21 1214 04/03/21 1229 04/03/21 1311  WBC 5.0  --   --   HGB 14.7 16.7 17.0  HCT 47.9 49.0 50.0  PLT 328  --   --    Recent Labs  Lab 04/03/21 1214 04/03/21 1229 04/03/21 1450 04/03/21 2309 04/04/21 0446  NA 128*   < > 130* 132* 132*  132*  K 5.3*   < > 4.5 4.1 4.4  4.4  CL 94*   < > 98 103 98  99  CO2 21*  --  19* 20* 22  24  BUN 16   < > 14 12 10  11   CREATININE 1.54*   < > 1.19 1.04 1.02  0.98  CALCIUM 9.1  --  8.8* 8.5* 8.9  8.8*  PROT 7.2  --   --   --  5.6*  BILITOT 1.1  --   --   --  0.7  ALKPHOS 115  --   --   --  88  ALT 20  --   --   --  13  AST 24  --   --   --  16  GLUCOSE 562*   < > 427* 374* 369*  368*   < > = values in this interval not displayed.    Imaging/Diagnostic Tests: None new today, CBGs as above  PGY-1, Psychiatry FPTS Intern pager: [phone], text pages welcome

## 2021-04-05 ENCOUNTER — Inpatient Hospital Stay (HOSPITAL_COMMUNITY): Payer: Medicaid Other

## 2021-04-05 ENCOUNTER — Other Ambulatory Visit (HOSPITAL_COMMUNITY): Payer: Self-pay

## 2021-04-05 DIAGNOSIS — R279 Unspecified lack of coordination: Secondary | ICD-10-CM | POA: Diagnosis not present

## 2021-04-05 DIAGNOSIS — I63312 Cerebral infarction due to thrombosis of left middle cerebral artery: Secondary | ICD-10-CM

## 2021-04-05 DIAGNOSIS — E878 Other disorders of electrolyte and fluid balance, not elsewhere classified: Secondary | ICD-10-CM

## 2021-04-05 DIAGNOSIS — N182 Chronic kidney disease, stage 2 (mild): Secondary | ICD-10-CM | POA: Diagnosis not present

## 2021-04-05 LAB — COMPREHENSIVE METABOLIC PANEL
ALT: 16 U/L (ref 0–44)
AST: 14 U/L — ABNORMAL LOW (ref 15–41)
Albumin: 2.8 g/dL — ABNORMAL LOW (ref 3.5–5.0)
Alkaline Phosphatase: 84 U/L (ref 38–126)
Anion gap: 10 (ref 5–15)
BUN: 11 mg/dL (ref 6–20)
CO2: 22 mmol/L (ref 22–32)
Calcium: 8.7 mg/dL — ABNORMAL LOW (ref 8.9–10.3)
Chloride: 102 mmol/L (ref 98–111)
Creatinine, Ser: 0.95 mg/dL (ref 0.61–1.24)
GFR, Estimated: >60 mL/min (ref >60)
Glucose, Bld: 279 mg/dL — ABNORMAL HIGH (ref 70–99)
Potassium: 4 mmol/L (ref 3.5–5.1)
Sodium: 134 mmol/L — ABNORMAL LOW (ref 135–145)
Total Bilirubin: 0.6 mg/dL (ref 0.3–1.2)
Total Protein: 5.7 g/dL — ABNORMAL LOW (ref 6.5–8.1)

## 2021-04-05 LAB — GLUCOSE, CAPILLARY
Glucose-Capillary: 281 mg/dL — ABNORMAL HIGH (ref 70–99)
Glucose-Capillary: 115 mg/dL — ABNORMAL HIGH (ref 70–99)

## 2021-04-05 MED ORDER — ATORVASTATIN CALCIUM 80 MG PO TABS
80.0000 mg | ORAL_TABLET | Freq: Every day | ORAL | 1 refills | Status: AC
  Filled 2021-04-05: qty 30, 30d supply, fill #0

## 2021-04-05 MED ORDER — INSULIN GLARGINE-YFGN 100 UNIT/ML ~~LOC~~ SOLN
32.0000 [IU] | Freq: Every day | SUBCUTANEOUS | Status: DC
  Filled 2021-04-05: qty 0.32

## 2021-04-05 MED ORDER — ATORVASTATIN CALCIUM 80 MG PO TABS: 80.0000 mg | ORAL_TABLET | Freq: Every day | ORAL | 1 refills | Status: DC

## 2021-04-05 NOTE — Discharge Instructions (Addendum)
It was a pleasure taking care of you while you were in the hospital!  While here you were treated for TIA, old stroke, and high blood sugar.  The neurology team recommends you continue taking ASA 81 mg once a day and eliquis 5mg  twice a day Please start taking atorvastatin 80 mg once a day to prevent heart attack and stroke Your hemoglobin A1c is elevated at 12 percent. Please continue your current medications and follow up with endocrinology on September 28th Physical therapy will be set up for you  If you have recurrence of your symptoms including difficulty speaking, difficulty walking, numbness, tingling, difficulty moving your arms and legs, or face droop please go to your nearest emergency room as soon as possible.

## 2021-04-05 NOTE — Discharge Summary (Signed)
Family Medicine Teaching [facility] Discharge Summary  Patient name: Bruce Vega Medical record number: [id number] Date of birth: Oct 27, 1980 Age: 40 y.o. Gender: male Date of Admission: 04/03/2021  Date of Discharge: 04/05/2021 Admitting Physician: Reece Leader, DO  Primary Care Provider: Pcp, No Consultants: Neurology  Indication for Hospitalization: R hand weakness, suspected stroke  Discharge Diagnoses/Problem List:  Right hand weakness, concern for stroke Hyperglycemia, T2DM Hyperlipidemia CAD, S/P 2 CABGs  Disposition: home  Discharge Condition: stable, improved  Discharge Exam:  Physical Exam Vitals reviewed.  Cardiovascular:     Rate and Rhythm: Normal rate and regular rhythm.     Heart sounds: No murmur heard. Pulmonary:     Effort: Pulmonary effort is normal.     Breath sounds: Normal breath sounds.  Abdominal:     General: Bowel sounds are normal.     Palpations: Abdomen is soft.     Tenderness: There is no abdominal tenderness.  Neurological:     Mental Status: He is alert.     Comments: Significant R hand weakness, poor grip strength, weak interosseous muscles   Brief Hospital Course:  Right hand weakness, concern for stroke Patient with reported Hx of several previous CVAs with relatively minor deficits. 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 CTH and MRI-brain were notable for three remote infarcts. On examination of the MRI-brain, the neurologist felt that there were small punctate lesions next to his previous L frontal infarct that may be responsible for his R hand weakness. Unfortunately this deficit did not resolve during the hospitalization. He had grip strength deficit and weak interosseous muscles on exam. Patient did not stay to complete echocardiography. Recommend that this be considered as an outpatient as patient did not want to stay for echo. He was discharged on his  previously prescribed ASA 81 mg and Eliquis 5 mg BID. To this regimen, atorvastatin 80 mg was added.    Hyperglycemia in the setting of T2DM Patient had a blood glucose of 562 on admission. He was started on an insulin drip, which corrected his hyperglycemia. He was eventually restarted on his home regimen and consistently had blood glucoses in the 200's. He was discharged on his home insulin regimen.   Hyperlipidemia Chol of 249, TG of 170, LDL 165. Patient was started on atorvastatin 80 mg.   CABGs Patient s/p 2 CABGs, with lipoprotein A. The patient reported he had follow up arranged on 9/28 with his cardiologist. He declined a referral to the lipid clinic at [facility].    AKI (resolved) Cr of 1.54 on admission, back to baseline with Cr of 1.02.   Issues for Follow Up:  Continue ASA 81 mg, Eliquis 5 mg BID, and atorvastatin 80 mg. Continue to encourage medication compliance. Continue to encourage compliance with insulin regimen.  Consider echocardiography as an outpatient  Significant Procedures: MRI-brain as above  Significant Labs and Imaging:  No results for input(s): WBC, HGB, HCT, PLT in the last 168 hours.  Recent Labs  Lab 04/04/21 0446 04/04/21 1013 04/04/21 1402 04/04/21 1839 04/05/21 0409  NA 132*  132* 132* 133* 133* 134*  K 4.4  4.4 4.3 4.0 4.0 4.0  CL 98  99 100 99 102 102  CO2 22  24 23 24 22 22   GLUCOSE 369*  368* 313* 315* 323* 279*  BUN 10  11 10 10 10 11   CREATININE 1.02  0.98 1.05 1.05 1.02 0.95  CALCIUM 8.9  8.8* 8.9 8.9 9.0 8.7*  ALKPHOS 88  --   --   --  84  AST 16  --   --   --  14*  ALT 13  --   --   --  16  ALBUMIN 2.8*  --   --   --  2.8*    Results/Tests Pending at Time of Discharge: none  Discharge Medications:  Allergies as of 04/05/2021       Reactions   Pineapple Swelling, Other (See Comments)   Oropharyngeal swelling        Medication List     TAKE these medications    acetaminophen 325 MG tablet Commonly known  as: TYLENOL Take 650 mg by mouth every 6 (six) hours as needed for mild pain or headache.   aspirin EC 81 MG tablet Take 81 mg by mouth daily. Swallow whole.   atorvastatin 80 MG tablet Commonly known as: LIPITOR Take 1 tablet (80 mg total) by mouth daily.   Eliquis 5 MG Tabs tablet Generic drug: apixaban Take 5 mg by mouth in the morning and at bedtime.   FreeStyle Libre 14 Day Sensor Misc Inject 1 Device into the skin every 14 (fourteen) days.   Lantus SoloStar 100 UNIT/ML Solostar Pen Generic drug: insulin glargine Inject 32 Units into the skin at bedtime.   nitroGLYCERIN 0.4 MG SL tablet Commonly known as: NITROSTAT Place 0.4 mg under the tongue every 5 (five) minutes as needed for chest pain.   NovoLOG FlexPen 100 UNIT/ML FlexPen Generic drug: insulin aspart Inject 12 Units into the skin 3 (three) times daily with meals.        Discharge Instructions: Please refer to Patient Instructions section of EMR for full details.  Patient was counseled important signs and symptoms that should prompt return to medical care, changes in medications, dietary instructions, activity restrictions, and follow up appointments.   Follow-Up Appointments:  Follow-up Information     [facility]. Schedule an appointment as soon as possible for a visit in 1 month(s).   Specialty: Neurology Why: stroke clinic Contact information: [address] [place] [address] [phone]        Warren Danes, FNP Follow up on 04/12/2021.   Specialty: Endocrinology Why: Appt at 1:30 PM Contact information: [address] [place] [address] [phone]         Clovis Riley, MD Follow up on 04/21/2021.   Specialty: Cardiology Why: Please go to appt at 3:20 PM Contact information: [address] [place] [address] [phone]         [facility]. Schedule an appointment as soon  as possible for a visit.   Why: Please call and schedule appointment. Contact information: [facility] [place], [place] [address] [phone]                Carlyn Reichert PGY-1, Psychiatry [facility]  Shirlean Mylar, MD [facility], PGY-3

## 2021-04-05 NOTE — Evaluation (Signed)
Occupational Therapy Evaluation Patient Details Name: Bruce Vega MRN: [id number] DOB: 1980/09/17 Today's Date: 04/05/2021   History of Present Illness Pt is a 40 y/o male admitted 9/19 secondary to R hand weakness. Also found to have hyperglycemia. Imaging negative for acute infarcts. PMH includes CVA X3, DM, and CAD s/p CABG.   Clinical Impression   Patient preparing for discharge this date.  R hand remains weak, but patient states he is at/near his baseline.  OT issued squeeze ball and built up handled utensils to increase use of his R hand during functional tasks.  Patient has had three prior CVA's and has ad OT in the past.  He is comfortable with the items issued, and understand there use.  No further needs in the acute setting.        Recommendations for follow up therapy are one component of a multi-disciplinary discharge planning process, led by the attending physician.  Recommendations may be updated based on patient status, additional functional criteria and insurance authorization.   Follow Up Recommendations  No OT follow up    Equipment Recommendations  None recommended by OT    Recommendations for Other Services       Precautions / Restrictions Precautions Precautions: None Restrictions Weight Bearing Restrictions: No      Mobility Bed Mobility               General bed mobility comments: up in recliner Patient Response: Flat affect  Transfers Overall transfer level: Needs assistance   Transfers: Sit to/from Stand;Stand Pivot Transfers Sit to Stand: Modified independent (Device/Increase time) Stand pivot transfers: Modified independent (Device/Increase time)            Balance Overall balance assessment: Needs assistance Sitting-balance support: Feet supported Sitting balance-Leahy Scale: Normal     Standing balance support: No upper extremity supported Standing balance-Leahy Scale: Good Standing balance comment: walking ad lib in the  room                           ADL either performed or assessed with clinical judgement   ADL Overall ADL's : At baseline                                             Vision Patient Visual Report: No change from baseline       Perception     Praxis      Pertinent Vitals/Pain Pain Assessment: No/denies pain     Hand Dominance Right   Extremity/Trunk Assessment Upper Extremity Assessment Upper Extremity Assessment: RUE deficits/detail RUE Deficits / Details: able to complete gross grasp and release, tip to tip and lateral pinch are more difficult due to R hhand weakness.  Gross grasp is a weak 3+/5 RUE Sensation: WNL RUE Coordination: decreased fine motor   Lower Extremity Assessment Lower Extremity Assessment: Defer to PT evaluation   Cervical / Trunk Assessment Cervical / Trunk Assessment: Normal   Communication Communication Communication: No difficulties   Cognition Arousal/Alertness: Awake/alert Behavior During Therapy: WFL for tasks assessed/performed Overall Cognitive Status: Within Functional Limits for tasks assessed                                     General Comments   VSS on RA  Exercises     Shoulder Instructions      Home Living Family/patient expects to be discharged to:: Private residence   Available Help at Discharge: Friend(s) Type of Home: Apartment Home Access: Stairs to enter   Entrance Stairs-Rails: Right;Left;Can reach both Home Layout: One level     Bathroom Shower/Tub: Chief Strategy Officer: Standard     Home Equipment: None          Prior Functioning/Environment Level of Independence: Independent        Comments: relies on friends for community mobility        OT Problem List: Decreased strength      OT Treatment/Interventions:      OT Goals(Current goals can be found in the care plan section) Acute Rehab OT Goals Patient Stated Goal: waiting on  medications and a friend for a ride OT Goal Formulation: With patient Time For Goal Achievement: 04/05/21 Potential to Achieve Goals: Good  OT Frequency:     Barriers to D/C:  None noted          Co-evaluation              AM-PAC OT "6 Clicks" Daily Activity     Outcome Measure Help from another person eating meals?: None Help from another person taking care of personal grooming?: None Help from another person toileting, which includes using toliet, bedpan, or urinal?: None Help from another person bathing (including washing, rinsing, drying)?: None Help from another person to put on and taking off regular upper body clothing?: None Help from another person to put on and taking off regular lower body clothing?: None 6 Click Score: 24   End of Session    Activity Tolerance: Patient tolerated treatment well Patient left: in chair  OT Visit Diagnosis: Muscle weakness (generalized) (M62.81)                Time: 0737-1062 OT Time Calculation (min): 12 min Charges:  OT General Charges $OT Visit: 1 Visit OT Evaluation $OT Eval Moderate Complexity: 1 Mod  04/05/2021  RP, OTR/L  Acute Rehabilitation Services  Office:  [phone]   Suzanna Obey 04/05/2021, 2:49 PM

## 2021-04-05 NOTE — TOC Transition Note (Addendum)
Transition of Care [facility]) - CM/SW Discharge Note   Patient Details  Name: Bruce Vega MRN: [id number] Date of Birth: 1981-04-22  [facility] CM/SW Contact:  Tom-Johnson, Hershal Coria, RN Phone Number: 04/05/2021, 1:38 PM   Clinical Narrative:    CM spoke with patient at bedside about TOC needs for post hospital transition. States he lives alone. Moved from [place] to [place] since his last stroke last November. Dad still alive, has 1 daughter and they are supportive. Has many friends who helps him as well. Does not drive due to previous stroke. Does not have or use any DME's. Gets his medications through Medicaid. Concerned about his medication Atorvastatin sent to his pharmacy in [place] and he now resides in [place]. Nurse Sue Lush contacted attending and was told they could not re send it to [facility] pharmacy. Patient was upset but states he will go to [place] and get his other medications refilled while he was there. Denies any other needs from CM. No further TOC needs at this time. 14:25- CM updated by nurse that Atorvastatin was sent to [facility] pharmacy. Patient notified and it will be delivered to him at bedside.  14:30- Patient given a list of outpatient services in [place] and he chose [facility]. Information placed on AVS and order given to patient. Nurse notified.    Final next level of care: Home/Self Care Barriers to Discharge: No Barriers Identified   Patient Goals and CMS Choice Patient states their goals for this hospitalization and ongoing recovery are:: To go home CMS Medicare.gov Compare Post Acute Care list provided to:: Patient    Discharge Placement                       Discharge Plan and Services                DME Arranged: N/A DME Agency: NA       HH Arranged: NA HH Agency: NA        Social Determinants of Health (SDOH) Interventions     Readmission Risk  Interventions No flowsheet data found.

## 2021-04-05 NOTE — Progress Notes (Signed)
DISCHARGE NOTE HOME Bruce Vega to be discharged Home per MD order. Discussed prescriptions and follow up appointments with the patient. Prescriptions given to patient; medication list explained in detail. Patient verbalized understanding.  Skin clean, dry and intact without evidence of skin break down, no evidence of skin tears noted. IV catheter discontinued intact. Site without signs and symptoms of complications. Dressing and pressure applied. Pt denies pain at the site currently. No complaints noted.  Patient free of lines, drains, and wounds.   An After Visit Summary (AVS) was printed and given to the patient. Patient escorted via wheelchair, and discharged home via private auto.  Myrtis Hopping, RN

## 2021-04-05 NOTE — Progress Notes (Signed)
This RN was called into patients room.  Patient stated that he was ready to leave and wanted me to notify Dr. Leveda Anna of this.  MD was called and notified of the patients request to not do scheduled Echo at 1300 and wanted his medication prescriptions to be filled for his discharge.  Patient informed of need to stay and get Echo, but still declined and said that he would contact his other doctors to see if test needed to be performed. MD stated that he would get his team to facilitate filling the prescriptions.  Will continue to monitor.  Jean Rosenthal, RN

## 2021-04-05 NOTE — Plan of Care (Signed)
  Problem: Health Behavior/Discharge Planning: Goal: Ability to manage health-related needs will improve Outcome: Progressing   Problem: Clinical Measurements: Goal: Ability to maintain clinical measurements within normal limits will improve Outcome: Progressing   Problem: Pain Managment: Goal: General experience of comfort will improve Outcome: Progressing   Problem: Safety: Goal: Ability to remain free from injury will improve Outcome: Progressing   

## 2022-02-12 IMAGING — MR MR HEAD W/O CM
6 of 11 series · 24 of 48 positions shown · non-contrast
Comparison: Prior CTs from earlier the same day.

CLINICAL DATA: Initial evaluation for neuro deficit, stroke
suspected, left hand weakness.

EXAM:
MRI HEAD WITHOUT CONTRAST
TECHNIQUE: Multiplanar, multiecho pulse sequences of the brain and surrounding
structures were obtained without intravenous contrast.

[Series 2: DWI · axial · 3.0mm · 0.94mm/px · z∈[-85,+64]mm · 7 of 104 slices shown (1 of 2)]
[im 1/104]
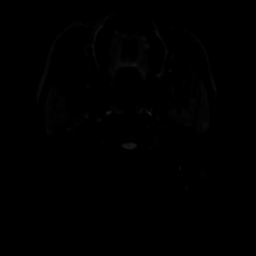
[im 18/104]
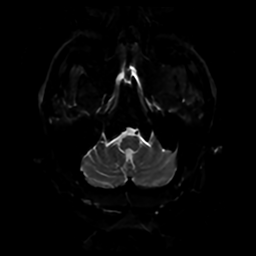
[im 35/104]
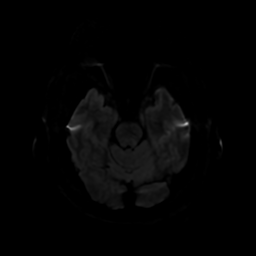
[im 52/104]
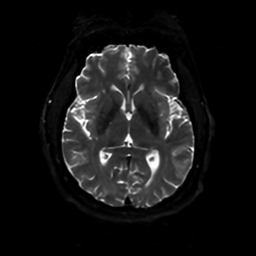
[im 69/104]
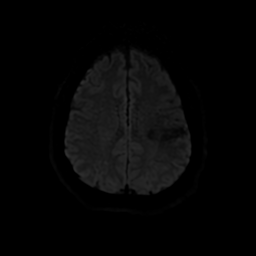
[im 86/104]
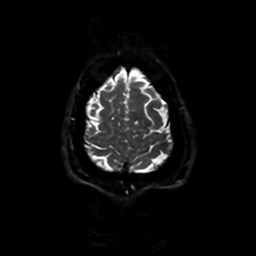
[im 104/104]
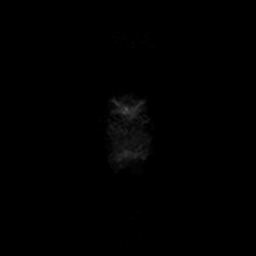

[Series 3: DWI · coronal · 4.0mm · 0.94mm/px · 5 of 74 slices shown (2 of 2)]
[im 1/74]
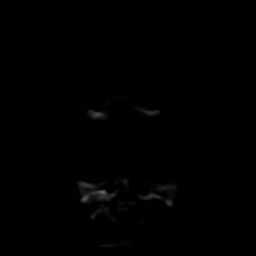
[im 19/74]
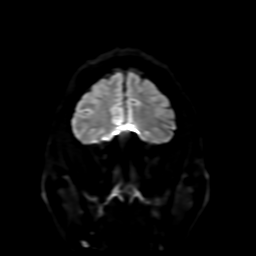
[im 37/74]
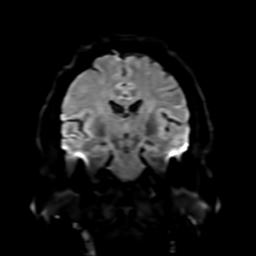
[im 55/74]
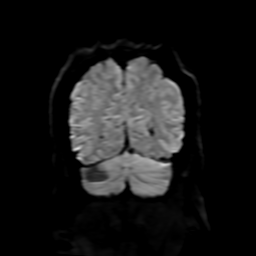
[im 74/74]
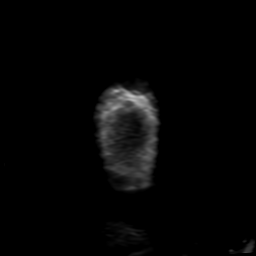

[Series 4: FLAIR · sagittal · 5.0mm · 0.23mm/px · 2 of 29 slices shown (1 of 2)]
[im 1/29]
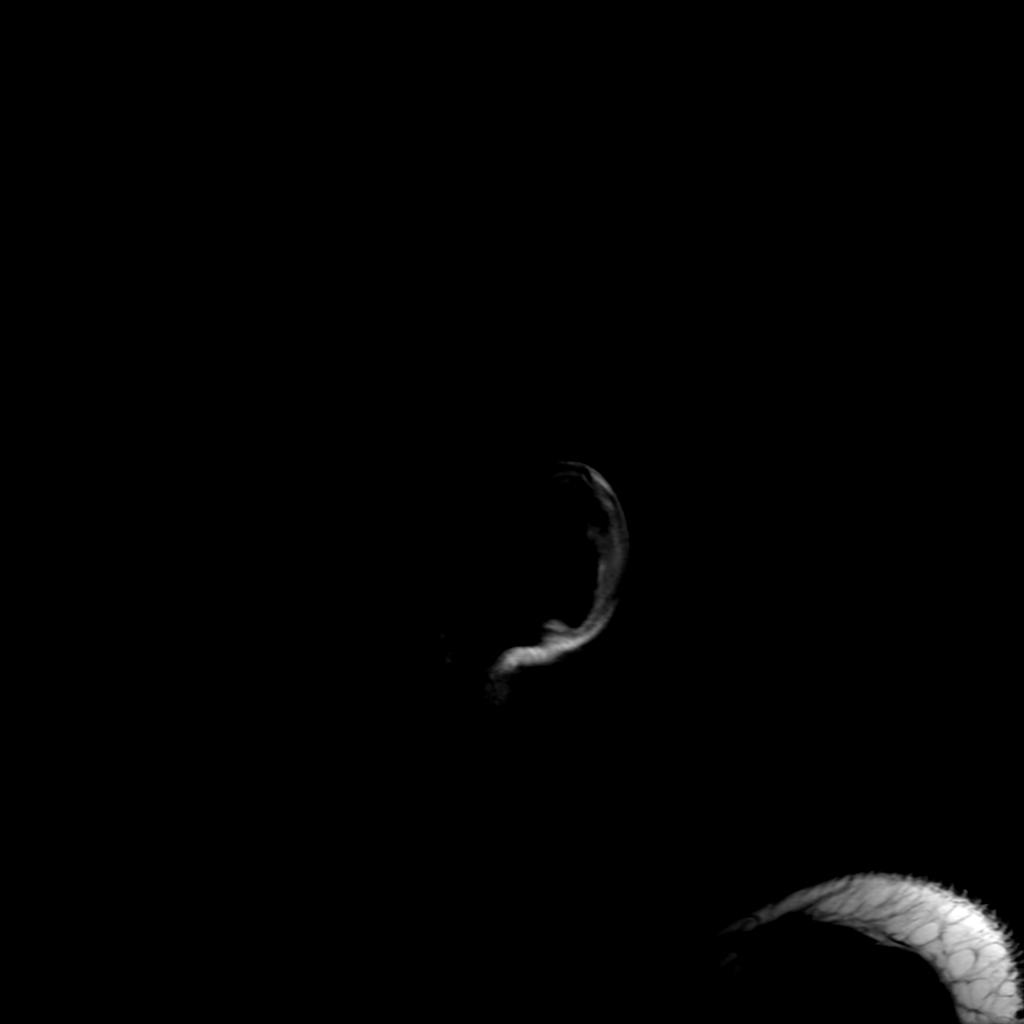
[im 29/29]
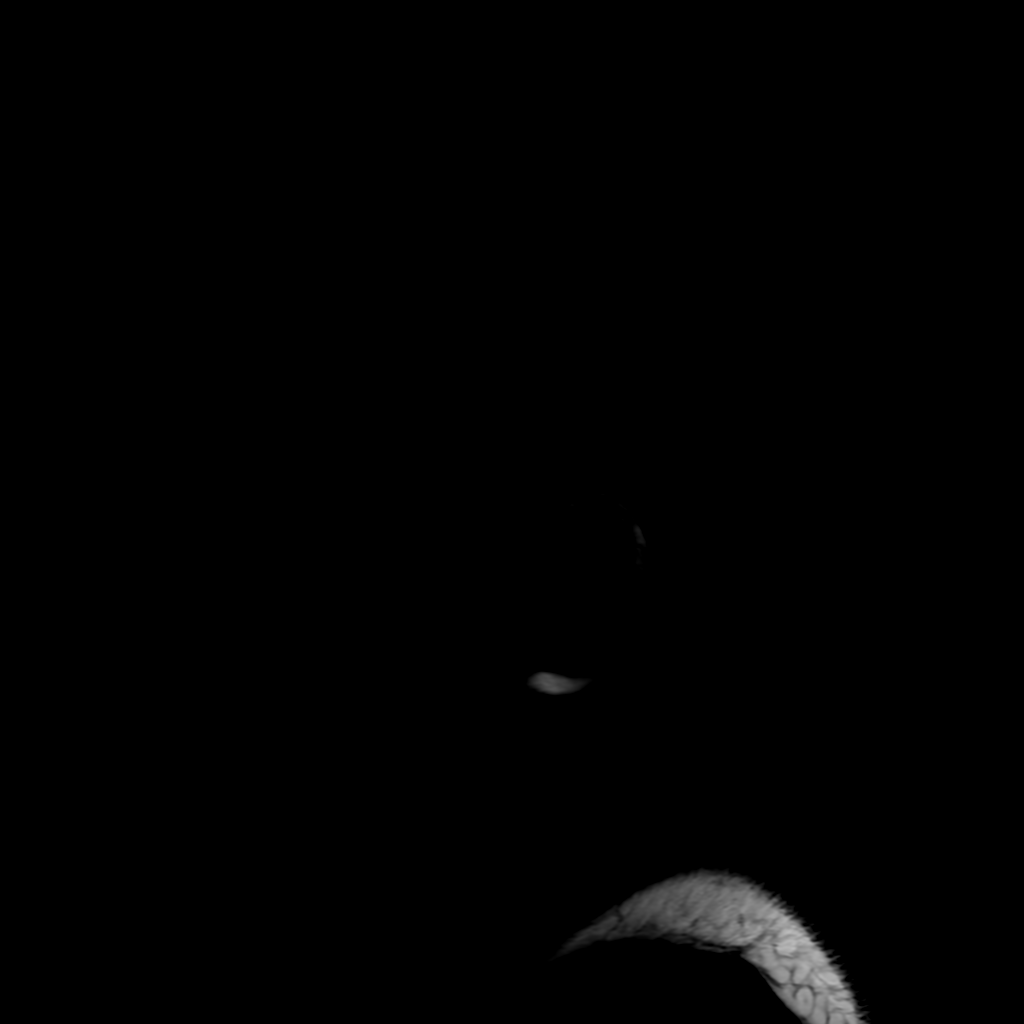

[Series 6: FLAIR · axial · 4.0mm · 0.45mm/px · z∈[-82,+68]mm · 3 of 36 slices shown (2 of 2)]
[im 1/36]
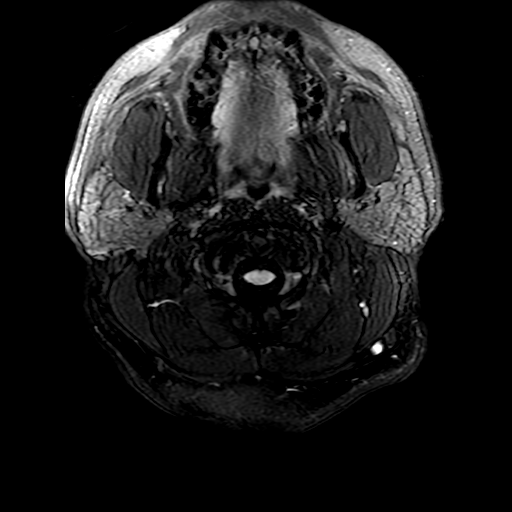
[im 18/36]
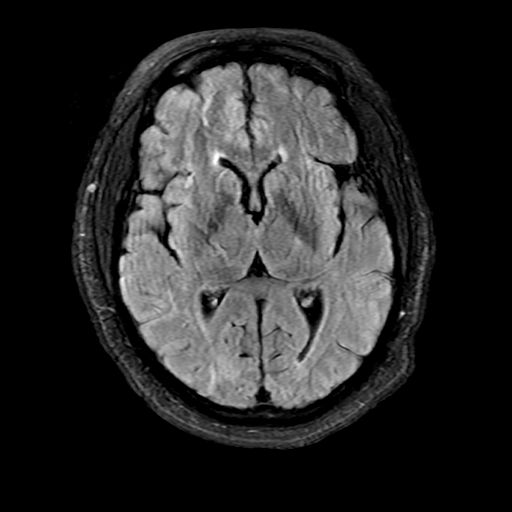
[im 36/36]
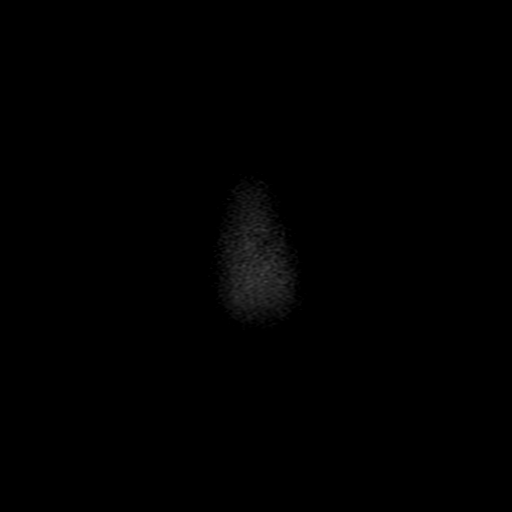

[Series 250: ADC · axial · 3.0mm · 0.94mm/px · z∈[-85,+64]mm · 4 of 52 slices shown (1 of 2)]
[im 1/52]
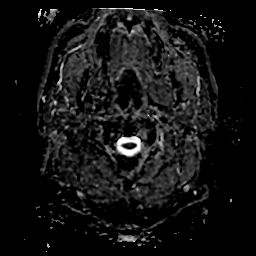
[im 18/52]
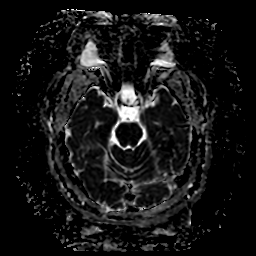
[im 35/52]
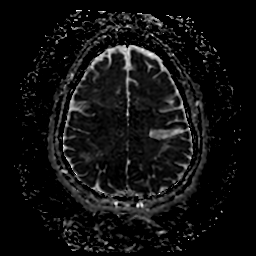
[im 52/52]
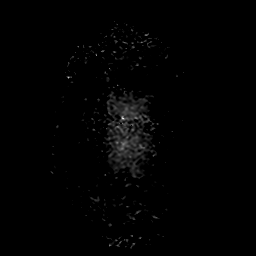

[Series 350: ADC · coronal · 4.0mm · 0.94mm/px · 3 of 36 slices shown (2 of 2)]
[im 1/36]
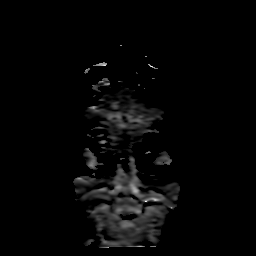
[im 18/36]
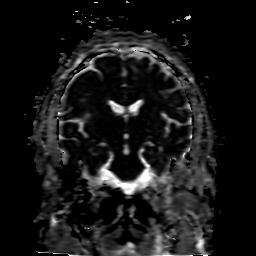
[im 36/36]
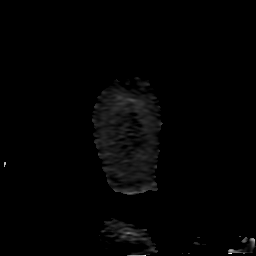

[24 of 48 positions shown; findings below may reference images not displayed]

FINDINGS: Brain: Cerebral volume within normal limits. Scattered patchy
T2/FLAIR hyperintensity involving the periventricular deep white
matter both cerebral hemispheres, most consistent with chronic small
vessel ischemic disease, advanced for age. Remote hemorrhagic
infarction involving the cortical/subcortical left frontal lobe
noted. Additional small remote cortical infarcts noted at the right
frontal and occipital lobes, with additional chronic right
cerebellar infarct.

No abnormal foci of restricted diffusion to suggest acute or
subacute ischemia. Gray-white matter differentiation otherwise
maintained. No evidence for acute intracranial hemorrhage.

No mass lesion, midline shift or mass effect. No hydrocephalus or
extra-axial fluid collection. Pituitary gland suprasellar region
within normal limits. Midline structures intact.

Vascular: Major intracranial vascular flow voids are maintained.

Skull and upper cervical spine: Craniocervical junction within
normal limits. Bone marrow signal intensity normal. No focal marrow
replacing lesion. No scalp soft tissue abnormality.

Sinuses/Orbits: Prior ocular lens replacement noted on the right.
Globes and orbital soft tissues demonstrate no acute finding.
Scattered mucosal thickening noted within the ethmoidal air cells
and maxillary sinuses. Paranasal sinuses are otherwise clear. No
mastoid effusion. Inner ear structures grossly normal.

Other: None.
IMPRESSION: 1. No acute intracranial infarct or other abnormality.
2. Remote infarcts involving the left frontal lobe, right frontal
lobe, right occipital lobe, and right cerebellum.
3. Underlying age advanced chronic microvascular ischemic disease.

## 2022-02-12 IMAGING — CT CT HEAD W/O CM
3 series · 15 of 47 positions shown, 18 images · non-contrast
Comparison: None.

CLINICAL DATA: Neurological deficit. Acute stroke suspected. Right
upper extremity weakness.

EXAM:
CT HEAD WITHOUT CONTRAST
TECHNIQUE: Contiguous axial images were obtained from the base of the skull
through the vertex without intravenous contrast.

[Series 4: head 5.0 h30s · axial · 0.42mm/px · z∈[-72,+53]mm · 9 of 31 slices shown, 12 images]
[im 3/31  brain]
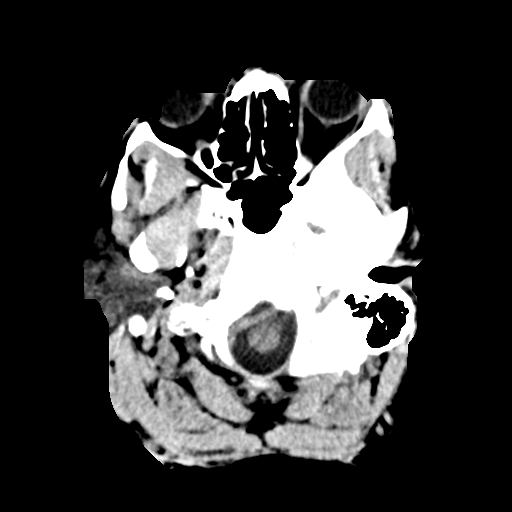
[im 3/31  bone]
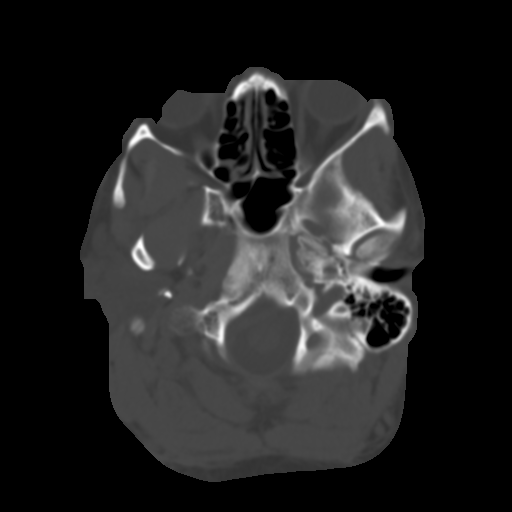
[im 6/31  brain]
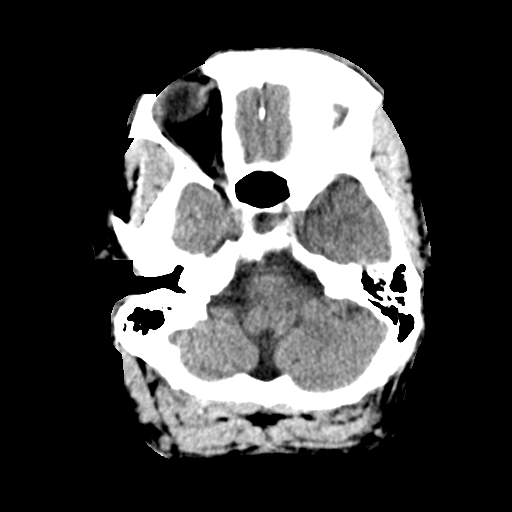
[im 9/31  brain]
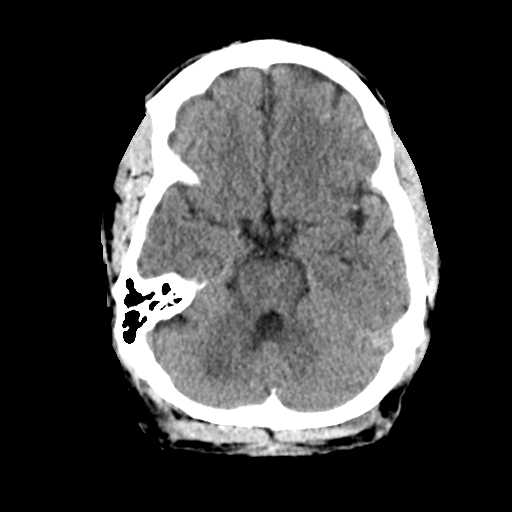
[im 12/31  brain]
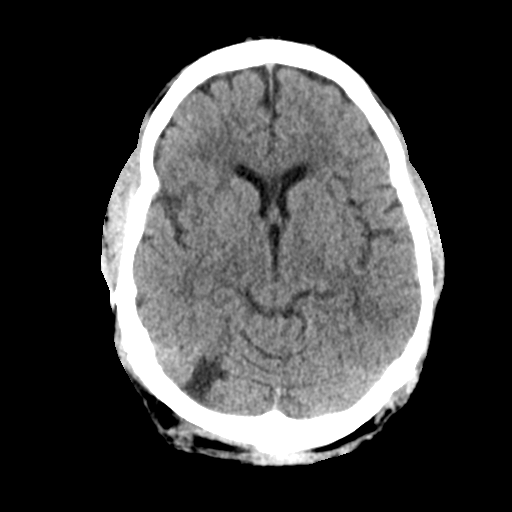
[im 16/31  brain]
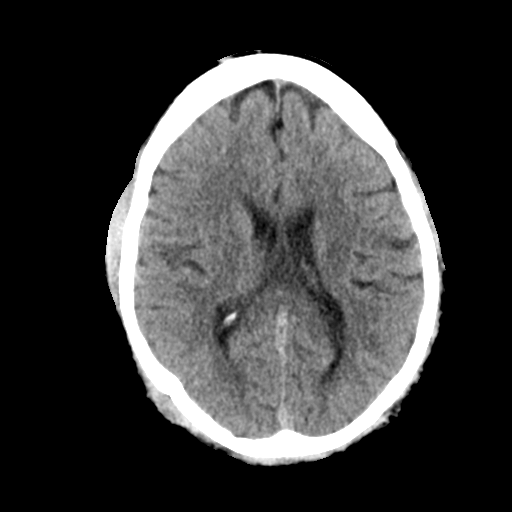
[im 16/31  bone]
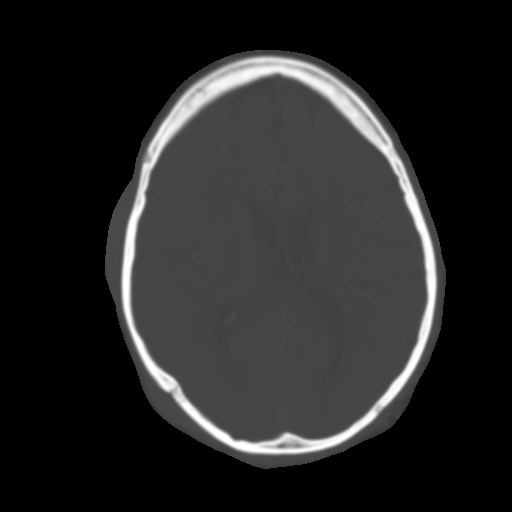
[im 19/31  brain]
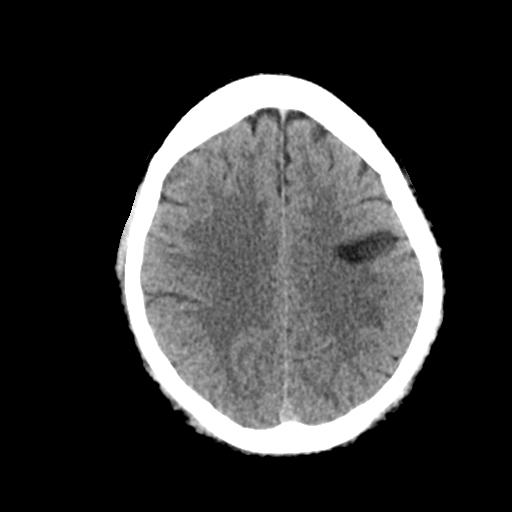
[im 22/31  brain]
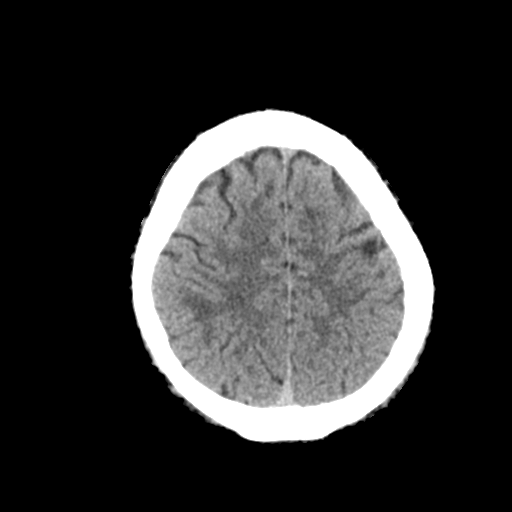
[im 25/31  brain]
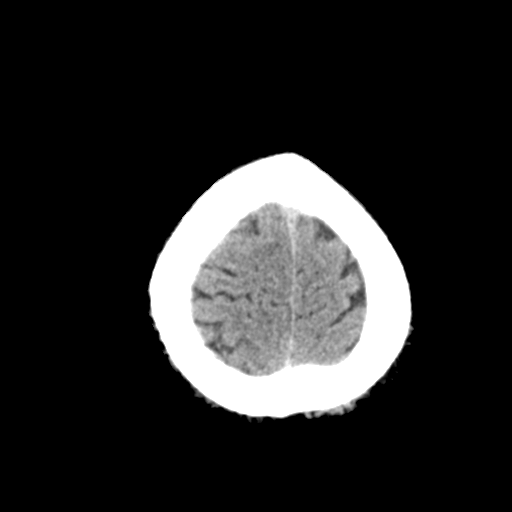
[im 28/31  brain]
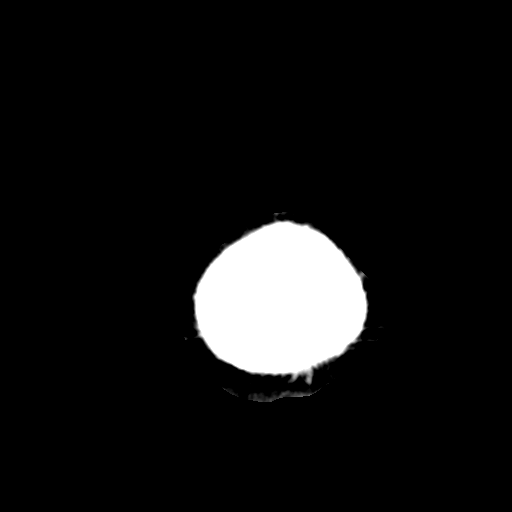
[im 28/31  bone]
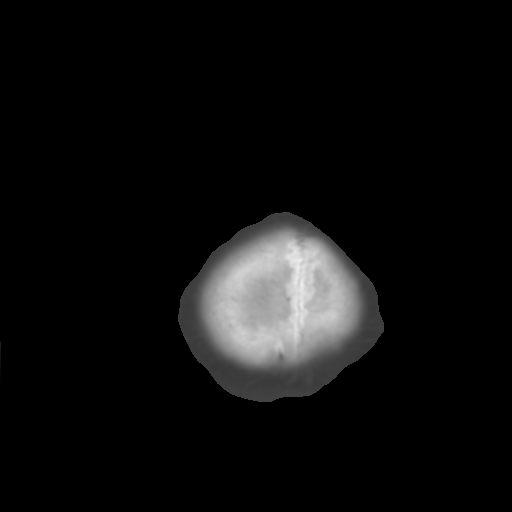

[Series 5: head 3.0 mpr cor · coronal · 0.29mm/px · 3 of 66 slices shown]
[im 22/66  brain]
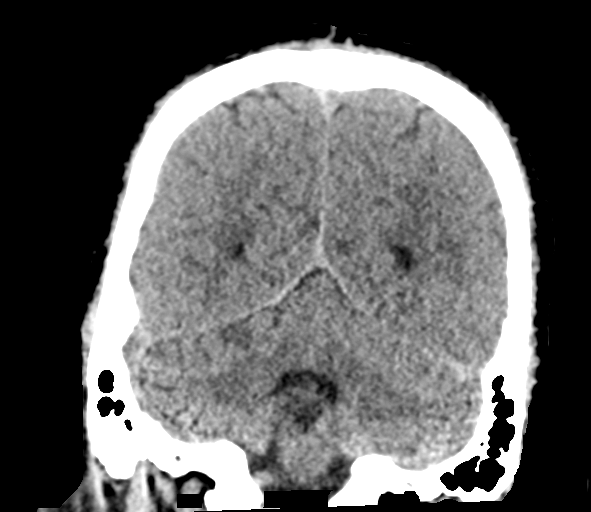
[im 29/66  brain]
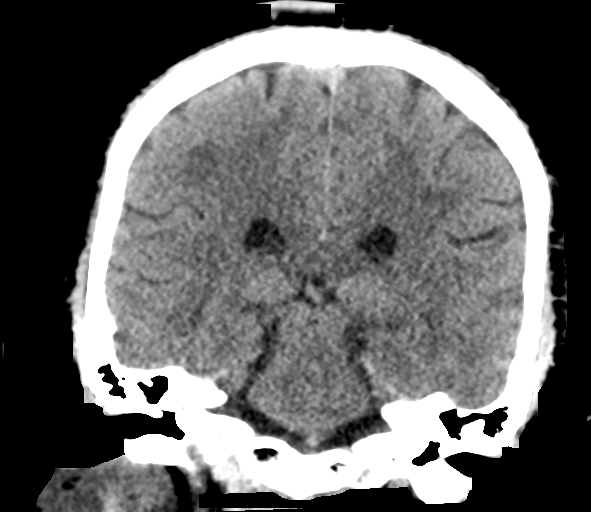
[im 37/66  brain]
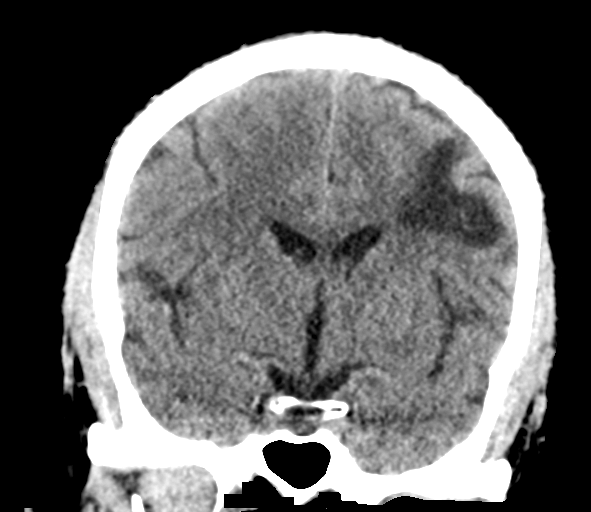

[Series 6: head 3.0 mpr sag · sagittal · 0.31mm/px · 3 of 57 slices shown]
[im 19/57  brain]
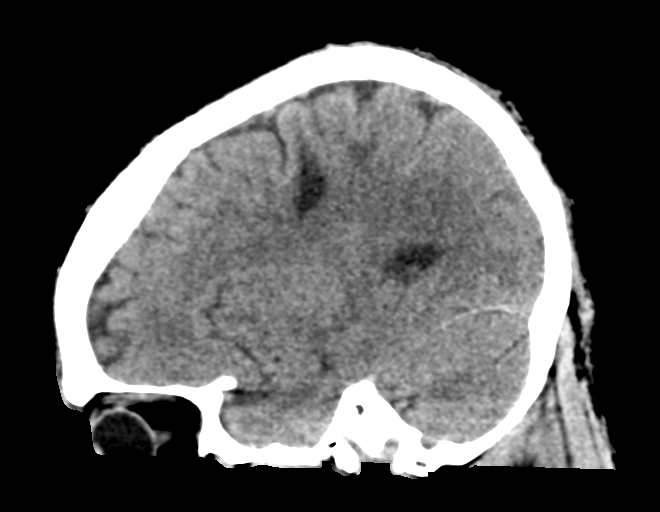
[im 29/57  brain]
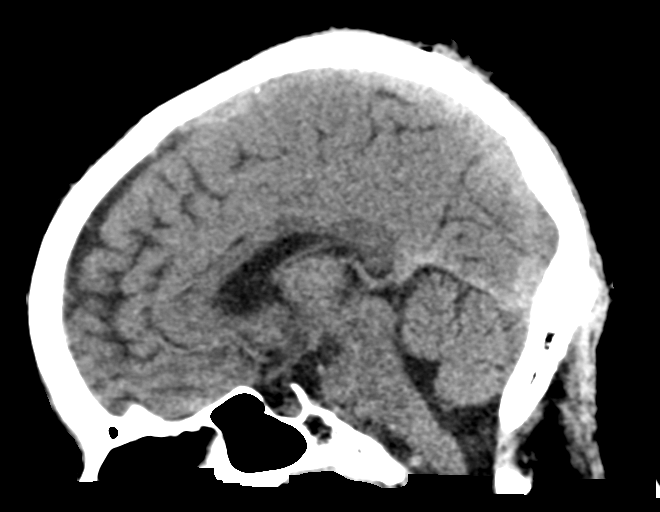
[im 38/57  brain]
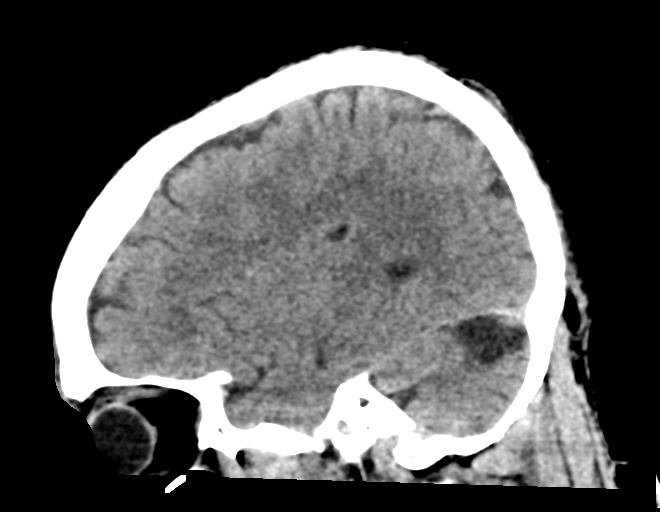

[15 of 47 positions shown; findings below may reference images not displayed]

FINDINGS: Brain: There is been an old infarction in the right superior
cerebellum. No acute posterior fossa finding. There is an old
infarction at the right parietooccipital junction. There is an old
infarction in the left posterior frontal lobe. No discernible acute
infarction, mass lesion, hemorrhage, hydrocephalus or extra-axial
collection.

Vascular: Negative

Skull: Normal

Sinuses/Orbits: Clear/normal

Other: None
IMPRESSION: No acute finding. Old superior cerebellar infarction on the right.
Old small right posterior parietal cortical infarction. Old left
posterior frontal infarction.

These results were communicated to Dr. Oleng At [DATE] on
04/03/2021 by text page via the [employer] messaging system.
